# Patient Record
Sex: Male | Born: 1980 | Race: White | Hispanic: Yes | Smoking: Former smoker
Health system: Southern US, Community
[De-identification: ages and names within clinical notes are randomized; demographics above are authoritative.]

## PROBLEM LIST (undated history)

## (undated) DIAGNOSIS — E785 Hyperlipidemia, unspecified: Secondary | ICD-10-CM

## (undated) DIAGNOSIS — F410 Panic disorder [episodic paroxysmal anxiety] without agoraphobia: Secondary | ICD-10-CM

## (undated) DIAGNOSIS — Q231 Congenital insufficiency of aortic valve: Secondary | ICD-10-CM

## (undated) DIAGNOSIS — R011 Cardiac murmur, unspecified: Secondary | ICD-10-CM

## (undated) DIAGNOSIS — Q2381 Bicuspid aortic valve: Secondary | ICD-10-CM

## (undated) DIAGNOSIS — F259 Schizoaffective disorder, unspecified: Secondary | ICD-10-CM

## (undated) DIAGNOSIS — D6800 Von Willebrand disease, unspecified: Secondary | ICD-10-CM

## (undated) HISTORY — PX: TOOTH EXTRACTION: SUR596

## (undated) HISTORY — PX: OTHER SURGICAL HISTORY: SHX169

## (undated) HISTORY — DX: Hyperlipidemia, unspecified: E78.5

## (undated) HISTORY — DX: Congenital insufficiency of aortic valve: Q23.1

## (undated) HISTORY — DX: Cardiac murmur, unspecified: R01.1

## (undated) HISTORY — DX: Bicuspid aortic valve: Q23.81

---

## 2014-09-17 ENCOUNTER — Encounter: Payer: Self-pay | Admitting: Emergency Medicine

## 2014-09-17 ENCOUNTER — Emergency Department
Admission: EM | Admit: 2014-09-17 | Discharge: 2014-09-17 | Disposition: A | Discharge status: Other Acute Inpatient Hospital | Payer: Medicaid Other | Attending: Emergency Medicine | Admitting: Emergency Medicine

## 2014-09-17 DIAGNOSIS — F203 Undifferentiated schizophrenia: Secondary | ICD-10-CM

## 2014-09-17 DIAGNOSIS — F23 Brief psychotic disorder: Secondary | ICD-10-CM | POA: Diagnosis not present

## 2014-09-17 DIAGNOSIS — J45909 Unspecified asthma, uncomplicated: Secondary | ICD-10-CM

## 2014-09-17 DIAGNOSIS — R440 Auditory hallucinations: Secondary | ICD-10-CM | POA: Diagnosis present

## 2014-09-17 DIAGNOSIS — Z720 Tobacco use: Secondary | ICD-10-CM | POA: Diagnosis not present

## 2014-09-17 DIAGNOSIS — Z880 Allergy status to penicillin: Secondary | ICD-10-CM | POA: Diagnosis not present

## 2014-09-17 DIAGNOSIS — Z79899 Other long term (current) drug therapy: Secondary | ICD-10-CM | POA: Insufficient documentation

## 2014-09-17 DIAGNOSIS — F3162 Bipolar disorder, current episode mixed, moderate: Secondary | ICD-10-CM

## 2014-09-17 HISTORY — DX: Von Willebrand's disease: D68.0

## 2014-09-17 HISTORY — DX: Schizoaffective disorder, unspecified: F25.9

## 2014-09-17 HISTORY — DX: Von Willebrand disease, unspecified: D68.00

## 2014-09-17 LAB — URINE DRUG SCREEN, QUALITATIVE (ARMC ONLY)
Amphetamines, Ur Screen: NOT DETECTED
BARBITURATES, UR SCREEN: NOT DETECTED
BENZODIAZEPINE, UR SCRN: NOT DETECTED
COCAINE METABOLITE, UR ~~LOC~~: NOT DETECTED
Cannabinoid 50 Ng, Ur ~~LOC~~: NOT DETECTED
MDMA (Ecstasy)Ur Screen: NOT DETECTED
Methadone Scn, Ur: NOT DETECTED
OPIATE, UR SCREEN: NOT DETECTED
PHENCYCLIDINE (PCP) UR S: NOT DETECTED
Tricyclic, Ur Screen: NOT DETECTED

## 2014-09-17 LAB — ETHANOL: Alcohol, Ethyl (B): <5 mg/dL (ref <5)

## 2014-09-17 LAB — COMPREHENSIVE METABOLIC PANEL
ALBUMIN: 4.5 g/dL (ref 3.5–5.0)
ALK PHOS: 69 U/L (ref 38–126)
ALT: 17 U/L (ref 17–63)
ANION GAP: 7 (ref 5–15)
AST: 22 U/L (ref 15–41)
BILIRUBIN TOTAL: 1 mg/dL (ref 0.3–1.2)
BUN: 13 mg/dL (ref 6–20)
CALCIUM: 8.7 mg/dL — AB (ref 8.9–10.3)
CO2: 25 mmol/L (ref 22–32)
CREATININE: 0.78 mg/dL (ref 0.61–1.24)
Chloride: 104 mmol/L (ref 101–111)
GFR calc Af Amer: >60 mL/min (ref >60)
GFR calc non Af Amer: >60 mL/min (ref >60)
GLUCOSE: 96 mg/dL (ref 65–99)
Potassium: 4.3 mmol/L (ref 3.5–5.1)
Sodium: 136 mmol/L (ref 135–145)
TOTAL PROTEIN: 8.4 g/dL — AB (ref 6.5–8.1)

## 2014-09-17 LAB — CBC
HCT: 47.4 % (ref 40.0–52.0)
Hemoglobin: 15.7 g/dL (ref 13.0–18.0)
MCH: 31.1 pg (ref 26.0–34.0)
MCHC: 33 g/dL (ref 32.0–36.0)
MCV: 94.3 fL (ref 80.0–100.0)
Platelets: 255 10*3/uL (ref 150–440)
RBC: 5.03 MIL/uL (ref 4.40–5.90)
RDW: 14.1 % (ref 11.5–14.5)
WBC: 11.1 10*3/uL — ABNORMAL HIGH (ref 3.8–10.6)

## 2014-09-17 LAB — ACETAMINOPHEN LEVEL: Acetaminophen (Tylenol), Serum: <10 ug/mL — ABNORMAL LOW (ref 10–30)

## 2014-09-17 LAB — SALICYLATE LEVEL: Salicylate Lvl: <4 mg/dL (ref 2.8–30.0)

## 2014-09-17 MED ORDER — HALOPERIDOL 5 MG PO TABS
10.0000 mg | ORAL_TABLET | Freq: Once | ORAL | Status: AC
  Administered 2014-09-17: 10 mg via ORAL
  Filled 2014-09-17: qty 2

## 2014-09-17 MED ORDER — OLANZAPINE 10 MG PO TABS: 10.0000 mg | ORAL_TABLET | Freq: Every day | ORAL | Status: DC

## 2014-09-17 MED ORDER — BENZTROPINE MESYLATE 0.5 MG PO TABS
1.0000 mg | ORAL_TABLET | ORAL | Status: AC
  Administered 2014-09-17: 1 mg via ORAL
  Filled 2014-09-17: qty 2

## 2014-09-17 NOTE — ED Notes (Signed)
BEHAVIORAL HEALTH ROUNDING Patient sleeping: No. Patient alert and oriented: yes Behavior appropriate: Yes.  ; If no, describe:  Nutrition and fluids offered: Yes Toileting and hygiene offered: Yes  Sitter present: yes Law enforcement present: Yes ODS  

## 2014-09-17 NOTE — ED Notes (Signed)

## 2014-09-17 NOTE — ED Provider Notes (Signed)
-----------------------------------------   6:18 PM on 09/17/2014 -----------------------------------------  Case was discussed with Dr. Mat Carne packs as evaluated the patient and recommends discharge. He does not feel the patient is an acute threat to himself. He recommends discharge with Zyprexa prescription which he has provided and follow-up with the patient's psychiatrist, Dr. Cherylann Ratel.  Gayla Doss, MD 09/17/14 [phone]

## 2014-09-17 NOTE — ED Notes (Signed)
Psychiatrist name: Dr. Cherylann Ratel.

## 2014-09-17 NOTE — ED Provider Notes (Signed)
[facility] Emergency Department Provider Note REMINDER - THIS NOTE IS NOT A FINAL MEDICAL RECORD UNTIL IT IS SIGNED. UNTIL THEN, THE CONTENT BELOW MAY REFLECT INFORMATION FROM A DOCUMENTATION TEMPLATE, NOT THE ACTUAL PATIENT VISIT. ____________________________________________  Time seen: Approximately 1:29 PM  I have reviewed the triage vital signs and the nursing notes.   HISTORY  Chief Complaint Hallucinations    HPI Taylor Bryant is a 34 y.o. male reports a long-standing history of psychiatric disorder including schizophrenia since the age of approximately 74. He reports that he was having some symptoms of shaking from his Haldol, and his dose was reduced a few days ago. Since that time he has been having hallucinations which are auditory and tell him to kill himself. He denies that he would actually kill himself, but he is concerned about the symptoms. He reports previous hospitalizations and a long-standing history of similar episodes.  He denies being in any pain. He saw his doctor Dr. Cherylann Ratel who placed him under involuntary commitment today. He denies any other concerns at this time. Denies homicidal thoughts.   Past Medical History  Diagnosis Date  . Von Willebrand disease   . Schizoaffective disorder     There are no active problems to display for this patient.   History reviewed. No pertinent past surgical history.  Current Outpatient Rx  Name  Route  Sig  Dispense  Refill  . benztropine (COGENTIN) 2 MG tablet   Oral   Take 2 mg by mouth daily.         . clonazePAM (KLONOPIN) 1 MG tablet   Oral   Take 1 mg by mouth at bedtime.         Marland Kitchen escitalopram (LEXAPRO) 20 MG tablet   Oral   Take 20 mg by mouth daily.         . haloperidol (HALDOL) 20 MG tablet   Oral   Take 20 mg by mouth at bedtime.         Marland Kitchen lithium 300 MG tablet   Oral   Take 900 mg by mouth at bedtime.           Allergies Penicillins  No family history on  file.  Social History Social History  Substance Use Topics  . Smoking status: Current Some Day Smoker  . Smokeless tobacco: None  . Alcohol Use: Yes    Review of Systems Constitutional: No fever/chills Eyes: No visual changes. ENT: No sore throat. Cardiovascular: Denies chest pain. Respiratory: Denies shortness of breath. Gastrointestinal: No abdominal pain.  No nausea, no vomiting.  No diarrhea.  No constipation. Genitourinary: Negative for dysuria. Musculoskeletal: Negative for back pain. Skin: Negative for rash. Neurological: Negative for headaches, focal weakness or numbness.  10-point ROS otherwise negative.  ____________________________________________   PHYSICAL EXAM:  VITAL SIGNS: ED Triage Vitals  Enc Vitals Group     BP 09/17/14 1239 138/81 mmHg     Pulse Rate 09/17/14 1239 67     Resp 09/17/14 1239 18     Temp 09/17/14 1239 97.5 F (36.4 C)     Temp Source 09/17/14 1239 Oral     SpO2 09/17/14 1239 97 %     Weight 09/17/14 1239 190 lb (86.183 kg)     Height 09/17/14 1239  (1.854 m)     Head Cir --      Peak Flow --      Pain Score --      Pain Loc --  Pain Edu? --      Excl. in GC? --    Constitutional: Alert and oriented. Well appearing and in no acute distress. Eyes: Conjunctivae are normal. PERRL. EOMI. Head: Atraumatic. Nose: No congestion/rhinnorhea. Mouth/Throat: Mucous membranes are moist.  Oropharynx non-erythematous. Neck: No stridor.   Cardiovascular: Normal rate, regular rhythm. Grossly normal heart sounds.  Good peripheral circulation. Respiratory: Normal respiratory effort.  No retractions. Lungs CTAB. Gastrointestinal: Soft and nontender. No distention. No abdominal bruits. No CVA tenderness. Musculoskeletal: No lower extremity tenderness nor edema.  No joint effusions. Neurologic:  Normal speech and language. No gross focal neurologic deficits are appreciated. No gait instability. No tremors appreciated at this time. Skin:   Skin is warm, dry and intact. No rash noted. Psychiatric: Mood and affect are normal. Speech and behavior are normal.  ____________________________________________   LABS (all labs ordered are listed, but only abnormal results are displayed)  Labs Reviewed  COMPREHENSIVE METABOLIC PANEL - Abnormal; Notable for the following:    Calcium 8.7 (*)    Total Protein 8.4 (*)    All other components within normal limits  ACETAMINOPHEN LEVEL - Abnormal; Notable for the following:    Acetaminophen (Tylenol), Serum <10 (*)    All other components within normal limits  CBC - Abnormal; Notable for the following:    WBC 11.1 (*)    All other components within normal limits  ETHANOL  SALICYLATE LEVEL  URINE DRUG SCREEN, QUALITATIVE ([facility] ONLY)   ____________________________________________  EKG   ____________________________________________  RADIOLOGY   ____________________________________________   PROCEDURES  Procedure(s) performed: None  Critical Care performed: No  ____________________________________________   INITIAL IMPRESSION / ASSESSMENT AND PLAN / ED COURSE  Pertinent labs & imaging results that were available during my care of the patient were reviewed by me and considered in my medical decision making (see chart for details).  Patient presents for concerns of psychiatric illness which has been worsening after decreasing his Haldol dose. He reports auditory command hallucinations telling him to kill himself. His initial screening exams not done straight any acute medical etiology but he is on involuntary commitment. I ordered psychiatric consultation. I anticipate this patient will likely require inpatient psychiatric care based on my initial assessment, but deferred a psychiatrist for full evaluation.  And is notable that the patient seems to have a lot of insight into his disease, he is very calm and very appropriate and amicable in the  ER.  ----------------------------------------- 2:08 PM on 09/17/2014 -----------------------------------------  Patient medically cleared for psychiatric evaluation. ____________________________________________   FINAL CLINICAL IMPRESSION(S) / ED DIAGNOSES  Final diagnoses:  Schizophrenia, acute      Sharyn Creamer, MD 09/17/14 1408

## 2014-09-17 NOTE — ED Notes (Signed)
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 Patient reports having auditory hallucinations that are saying things initially before admission for him to buy stuff and to kill himself. He was also having visual hallucinations that were demons.  Currently patient is only having auditory hallucinations that are telling him "this is going to suck" but denies any telling him to kill himself or others.  Pt reports some paranoia, but also reports not having been hospitalized before for this and is fearful.  Patient currently sees a psychiatrist in [place], but is unsure of their name.  He denies any history of trying to kill himself or anyone else.  Has never cut himself or had any self harm.  Last alcohol use was last Saturday.  He is on medications and states he has been taking them.  He has a history of Schizoaffective, Bipolar disorder. Currently lives alone.  Reports hallucinations every 2 days.  Denies smoking or using any illegal drugs.  Pt is pleasant, but flat affect.

## 2014-09-17 NOTE — ED Notes (Signed)
BEHAVIORAL HEALTH ROUNDING Patient sleeping: No. Patient alert and oriented: yes Behavior appropriate: Yes.  ; If no, describe:  Nutrition and fluids offered: Yes  Toileting and hygiene offered: Yes  Sitter present: yes Law enforcement present: Yes  

## 2014-09-17 NOTE — ED Notes (Signed)
Pt to ED accompanied by [facility] with IVC papers in place, papers report that pt has been reporting hallucinations that area commanding him to commit suicide, delusions and mania

## 2014-09-17 NOTE — ED Notes (Signed)
BEHAVIORAL HEALTH ROUNDING Patient sleeping: No. Patient alert and oriented: yes Behavior appropriate: Yes.  ; If no, describe:  Nutrition and fluids offered: No Toileting and hygiene offered: Yes  Sitter present: ED tech performing 15 minute checks. Law enforcement present: Yes  [employer]
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
## 2014-09-17 NOTE — ED Notes (Signed)
ED BHU PLACEMENT JUSTIFICATION Is the patient under IVC or is there intent for IVC: Yes.   Is the patient medically cleared: Yes.   Is there vacancy in the ED BHU: Yes.   Is the population mix appropriate for patient: Yes.   Is the patient awaiting placement in inpatient or outpatient setting: No. Has the patient had a psychiatric consult: No. Survey of unit performed for contraband, proper placement and condition of furniture, tampering with fixtures in bathroom, shower, and each patient room: Yes.  ; Findings: No contraband APPEARANCE/BEHAVIOR Pt is calm and cooperative.  Pt reports having paranoia, but is aware of surroundings. NEURO ASSESSMENT Orientation: alert and oriented x 4 Hallucinations: Yes.  Auditory Hallucinations and Visual Hallucinations Speech: Normal Gait: normal RESPIRATORY ASSESSMENT Normal expansion.  Clear to auscultation.  No rales, rhonchi, or wheezing. CARDIOVASCULAR ASSESSMENT Regular rate.  Skin warm and dry. GASTROINTESTINAL ASSESSMENT No GI complaints. Meal given EXTREMITIES normal strength, tone, and muscle mass. Full ROM PLAN OF CARE Provide calm/safe environment. Vital signs assessed twice daily. ED BHU Assessment once each 12-hour shift. Collaborate with intake RN daily or as condition indicates. Assure the ED provider has rounded once each shift. Provide and encourage hygiene. Provide redirection as needed. Assess for escalating behavior; address immediately and inform ED provider.  Assess family dynamic and appropriateness for visitation as needed: Yes.  ; If necessary, describe findings: no known family issues. Patient lives alone. Educate the patient/family about BHU procedures/visitation: Yes.  ; If necessary, describe findings: No family present, but patient is aware of rules and is compliant.

## 2014-09-17 NOTE — Consult Note (Signed)
[facility] Face-to-Face Psychiatry Consult   Reason for Consult:  Consult for this 34 year old man who came to the emergency room stating he is having auditory hallucinations Referring Physician:  Quale Patient Identification: Taylor Bryant MRN:  [id number] Principal Diagnosis: Schizophrenia Diagnosis:   Patient Active Problem List   Diagnosis Date Noted  . Schizophrenia [F20.9] 09/17/2014  . Asthma [J45.909] 09/17/2014    Total Time spent with patient: 1 hour  Subjective:   Taylor Bryant is a 34 y.o. male patient admitted with "they changed my medicine and the voices are worse ".  HPI:  Patient states that his auditory hallucinations have gotten a little worse over the last few days. He hears voices constantly and has done so for years but they have been bothering him worse the last couple days. He also says that he's been feeling more depressed for the last few days. This started when his medicines were changed. His psychiatrist discontinued his Haldol because he was having lots of tremors and stiffness from it. They did not start him on any new anti-psychotic but left him with just his lithium and Klonopin and Cogentin. Patient says that he's been sleeping a little bit worse. Appetite is okay. He does not have any thoughts or wish of killing himself. Despite the fact that the voices tell him to do bad things like to kill himself has never tried to kill himself in the past and has no intention of doing that. Also does not feel violent or like he is going to hurt anyone else. Patient would like to be back on some medication. He is not drinking or abusing any drugs currently. Use some alcohol about a week ago.  Past psychiatric history: Long-standing history of schizophrenia or schizoaffective disorder. Despite this he's never been in a psychiatric hospital. Telford Nab states per day compliant with his medicine. No history of suicide attempts or violence towards others. He said he's been on several medicines in  the past. He often gets stiffness from them. The Zyprexa however was pretty helpful.  Family history: He says he has a couple of uncles who had mental health problems  Social history: Currently living by himself. He and his wife separated about 3 months ago. He does not get disability but his family in [place] sends him money. He does have Medicaid and is seen by a local mental health provider. Doesn't have any children. Not currently working.  Medical history: Mild asthma also reportedly has von Willebrand's disease  Current medication: Lithium and Cogentin and Klonopin and no interest psychotic for the last few days  Substance abuse history: Says he used to drink quite a bit and it caused him some problems. He stopped drinking many months ago. Had a relapse last weekend but none since then. HPI Elements:   Quality:  Auditory hallucinations and some mood. Severity:  Moderate to severe but not immediately life-threatening. Timing:  Long-standing issue worse over the last week. Duration:  Chronic issue. Context:  Recent change in medicine.  Past Medical History:  Past Medical History  Diagnosis Date  . Von Willebrand disease   . Schizoaffective disorder    History reviewed. No pertinent past surgical history. Family History: No family history on file. Social History:  History  Alcohol Use  . Yes     History  Drug Use No    Social History   Social History  . Marital Status: Single    Spouse Name: N/A  . Number of Children: N/A  .  Years of Education: N/A   Social History Main Topics  . Smoking status: Current Some Day Smoker  . Smokeless tobacco: None  . Alcohol Use: Yes  . Drug Use: No  . Sexual Activity: Not Asked   Other Topics Concern  . None   Social History Narrative  . None   Additional Social History:    Pain Medications: None Reported Prescriptions: None Reported Over the Counter: None Reported History of alcohol / drug use?: No history of alcohol /  drug abuse Longest period of sobriety (when/how long):  (None Reported) Negative Consequences of Use:  (None Reported) Withdrawal Symptoms:  (None Reported)                     Allergies:   Allergies  Allergen Reactions  . Penicillins Rash    Labs:  Results for orders placed or performed during the hospital encounter of 09/17/14 (from the past 48 hour(s))  Comprehensive metabolic panel     Status: Abnormal   Collection Time: 09/17/14 12:53 PM  Result Value Ref Range   Sodium 136 135 - 145 mmol/L   Potassium 4.3 3.5 - 5.1 mmol/L   Chloride 104 101 - 111 mmol/L   CO2 25 22 - 32 mmol/L   Glucose, Bld 96 65 - 99 mg/dL   BUN 13 6 - 20 mg/dL   Creatinine, Ser 0.78 0.61 - 1.24 mg/dL   Calcium 8.7 (L) 8.9 - 10.3 mg/dL   Total Protein 8.4 (H) 6.5 - 8.1 g/dL   Albumin 4.5 3.5 - 5.0 g/dL   AST 22 15 - 41 U/L   ALT 17 17 - 63 U/L   Alkaline Phosphatase 69 38 - 126 U/L   Total Bilirubin 1.0 0.3 - 1.2 mg/dL   GFR calc non Af Amer >60 >60 mL/min   GFR calc Af Amer >60 >60 mL/min    Comment: (NOTE) The eGFR has been calculated using the CKD EPI equation. This calculation has not been validated in all clinical situations. eGFR's persistently <60 mL/min signify possible Chronic Kidney Disease.    Anion gap 7 5 - 15  Ethanol (ETOH)     Status: None   Collection Time: 09/17/14 12:53 PM  Result Value Ref Range   Alcohol, Ethyl (B) <5 <5 mg/dL    Comment:        LOWEST DETECTABLE LIMIT FOR SERUM ALCOHOL IS 5 mg/dL FOR MEDICAL PURPOSES ONLY   Salicylate level     Status: None   Collection Time: 09/17/14 12:53 PM  Result Value Ref Range   Salicylate Lvl <8.4 2.8 - 30.0 mg/dL  Acetaminophen level     Status: Abnormal   Collection Time: 09/17/14 12:53 PM  Result Value Ref Range   Acetaminophen (Tylenol), Serum <10 (L) 10 - 30 ug/mL    Comment:        THERAPEUTIC CONCENTRATIONS VARY SIGNIFICANTLY. A RANGE OF 10-30 ug/mL MAY BE AN EFFECTIVE CONCENTRATION FOR MANY  PATIENTS. HOWEVER, SOME ARE BEST TREATED AT CONCENTRATIONS OUTSIDE THIS RANGE. ACETAMINOPHEN CONCENTRATIONS >150 ug/mL AT 4 HOURS AFTER INGESTION AND >50 ug/mL AT 12 HOURS AFTER INGESTION ARE OFTEN ASSOCIATED WITH TOXIC REACTIONS.   CBC     Status: Abnormal   Collection Time: 09/17/14 12:53 PM  Result Value Ref Range   WBC 11.1 (H) 3.8 - 10.6 K/uL   RBC 5.03 4.40 - 5.90 MIL/uL   Hemoglobin 15.7 13.0 - 18.0 g/dL   HCT 47.4 40.0 - 52.0 %  MCV 94.3 80.0 - 100.0 fL   MCH 31.1 26.0 - 34.0 pg   MCHC 33.0 32.0 - 36.0 g/dL   RDW 14.1 11.5 - 14.5 %   Platelets 255 150 - 440 K/uL  Urine Drug Screen, Qualitative ([facility] only)     Status: None   Collection Time: 09/17/14 12:53 PM  Result Value Ref Range   Tricyclic, Ur Screen NONE DETECTED NONE DETECTED   Amphetamines, Ur Screen NONE DETECTED NONE DETECTED   MDMA (Ecstasy)Ur Screen NONE DETECTED NONE DETECTED   Cocaine Metabolite,Ur [place] NONE DETECTED NONE DETECTED   Opiate, Ur Screen NONE DETECTED NONE DETECTED   Phencyclidine (PCP) Ur S NONE DETECTED NONE DETECTED   Cannabinoid 50 Ng, Ur [place] NONE DETECTED NONE DETECTED   Barbiturates, Ur Screen NONE DETECTED NONE DETECTED   Benzodiazepine, Ur Scrn NONE DETECTED NONE DETECTED   Methadone Scn, Ur NONE DETECTED NONE DETECTED    Comment: (NOTE) 482  Tricyclics, urine               Cutoff 1000 ng/mL 200  Amphetamines, urine             Cutoff 1000 ng/mL 300  MDMA (Ecstasy), urine           Cutoff 500 ng/mL 400  Cocaine Metabolite, urine       Cutoff 300 ng/mL 500  Opiate, urine                   Cutoff 300 ng/mL 600  Phencyclidine (PCP), urine      Cutoff 25 ng/mL 700  Cannabinoid, urine              Cutoff 50 ng/mL 800  Barbiturates, urine             Cutoff 200 ng/mL 900  Benzodiazepine, urine           Cutoff 200 ng/mL 1000 Methadone, urine                Cutoff 300 ng/mL 1100 1200 The urine drug screen provides only a preliminary, unconfirmed 1300 analytical test result and should  not be used for non-medical 1400 purposes. Clinical consideration and professional judgment should 1500 be applied to any positive drug screen result due to possible 1600 interfering substances. A more specific alternate chemical method 1700 must be used in order to obtain a confirmed analytical result.  1800 Gas chromato graphy / mass spectrometry (GC/MS) is the preferred 1900 confirmatory method.     Vitals: Blood pressure 138/81, pulse 67, temperature 97.5 F (36.4 C), temperature source Oral, resp. rate 18, height _0  (1.854 m), weight 86.183 kg (190 lb), SpO2 97 %.  Risk to Self: Suicidal Ideation: No Suicidal Intent: No Is patient at risk for suicide?: No Suicidal Plan?: No Access to Means: No What has been your use of drugs/alcohol within the last 12 months?: None Reported How many times?: 1 (Per the report of his wife) Other Self Harm Risks: None Reported Triggers for Past Attempts: Unpredictable (Per his wife, he become manic every June) Intentional Self Injurious Behavior: None Risk to Others:   Prior Inpatient Therapy:   Prior Outpatient Therapy:    No current facility-administered medications for this encounter.   Current Outpatient Prescriptions  Medication Sig Dispense Refill  . benztropine (COGENTIN) 2 MG tablet Take 2 mg by mouth daily.    . clonazePAM (KLONOPIN) 0.5 MG tablet Take 0.5 mg by mouth 2 (two) times daily.    Marland Kitchen  escitalopram (LEXAPRO) 10 MG tablet Take 10 mg by mouth daily.    . haloperidol (HALDOL) 20 MG tablet Take 20 mg by mouth at bedtime.    Marland Kitchen lithium carbonate (ESKALITH) 450 MG CR tablet Take 900 mg by mouth at bedtime.    Marland Kitchen OLANZapine (ZYPREXA) 10 MG tablet Take 1 tablet (10 mg total) by mouth at bedtime. 30 tablet 0    Musculoskeletal: Strength & Muscle Tone: within normal limits Gait & Station: normal Patient leans: N/A  Psychiatric Specialty Exam: Physical Exam  Nursing note and vitals reviewed. Constitutional: He appears  well-developed and well-nourished.  HENT:  Head: Normocephalic and atraumatic.  Eyes: Conjunctivae are normal. Pupils are equal, round, and reactive to light.  Neck: Normal range of motion.  Cardiovascular: Normal heart sounds.   Respiratory: Effort normal.  GI: Soft.  Musculoskeletal: Normal range of motion.  Neurological: He is alert.  Skin: Skin is warm and dry.  Psychiatric: He has a normal mood and affect. His behavior is normal. Judgment and thought content normal. His speech is delayed. Cognition and memory are normal.    Review of Systems  Constitutional: Negative.   HENT: Negative.   Eyes: Negative.   Respiratory: Negative.   Cardiovascular: Negative.   Gastrointestinal: Negative.   Musculoskeletal: Negative.   Skin: Negative.   Neurological: Negative.   Psychiatric/Behavioral: Positive for hallucinations. Negative for depression, suicidal ideas, memory loss and substance abuse. The patient is not nervous/anxious and does not have insomnia.     Blood pressure 138/81, pulse 67, temperature 97.5 F (36.4 C), temperature source Oral, resp. rate 18, height _0  (1.854 m), weight 86.183 kg (190 lb), SpO2 97 %.Body mass index is 25.07 kg/(m^2).  General Appearance: Neat  Eye Contact::  Good  Speech:  Clear and Coherent  Volume:  Decreased  Mood:  Anxious  Affect:  Flat  Thought Process:  Linear  Orientation:  Full (Time, Place, and Person)  Thought Content:  Hallucinations: Auditory  Suicidal Thoughts:  No  Homicidal Thoughts:  No  Memory:  Immediate;   Good Recent;   Fair Remote;   Fair  Judgement:  Intact  Insight:  Present  Psychomotor Activity:  Normal  Concentration:  Good  Recall:  Good  Fund of Knowledge:Good  Language: Fair  Akathisia:  No  Handed:  Right  AIMS (if indicated):     Assets:  Communication Skills Desire for Improvement Housing Leisure Time Resilience  ADL's:  Intact  Cognition: WNL  Sleep:      Medical Decision Making: Review of  Psycho-Social Stressors (1), Review or order clinical lab tests (1), Established Problem, Worsening (2), Review or order medicine tests (1), Review of Medication Regimen & Side Effects (2) and Review of New Medication or Change in Dosage (2)  Treatment Plan Summary: Medication management and Plan This is a patient who appears to have schizophrenia. He has good insight and judgment. No history of suicidal behavior or dangerous behavior. He is willing to be compliant with medicine. Is not abusing drugs. Although his auditory hallucinations have suicidal content he has good insight and is not acting on them. At this point I don't think he meets commitment criteria. He is agreeable to restarting medicine. I propose giving him a prescription for olanzapine 10 mg at night. Side effects discussed. Patient is very willing to start it. He will follow-up with his community mental health team with psychotherapeutic services. Supportive counseling done. Reminded him that he can come back to the emergency  room if necessary any time. Discontinue IVC. Follow-up in the community. Case discussed with emergency room doctor  Plan:  Patient does not meet criteria for psychiatric inpatient admission. Supportive therapy provided about ongoing stressors. Discussed crisis plan, support from social network, calling 911, coming to the Emergency Department, and calling Suicide Hotline. Disposition: Discontinue IVC  Prescription written follow-up in the community  [place] 09/17/2014 5:15 PM

## 2014-09-17 NOTE — ED Notes (Signed)
Pt's lunch finally came - so letting him finish it before leaving. He is going to catch a taxi

## 2017-08-29 ENCOUNTER — Ambulatory Visit: Payer: Self-pay

## 2017-09-10 ENCOUNTER — Ambulatory Visit: Payer: Self-pay | Admitting: Family Medicine

## 2017-09-10 VITALS — BP 119/75 | HR 76 | Temp 97.6°F | Wt 194.4 lb

## 2017-09-10 DIAGNOSIS — F3160 Bipolar disorder, current episode mixed, unspecified: Secondary | ICD-10-CM

## 2017-09-10 DIAGNOSIS — Z09 Encounter for follow-up examination after completed treatment for conditions other than malignant neoplasm: Secondary | ICD-10-CM

## 2017-09-10 DIAGNOSIS — Z8679 Personal history of other diseases of the circulatory system: Secondary | ICD-10-CM

## 2017-09-10 DIAGNOSIS — Z0000 Encounter for general adult medical examination without abnormal findings: Secondary | ICD-10-CM

## 2017-09-10 DIAGNOSIS — Z7689 Persons encountering health services in other specified circumstances: Secondary | ICD-10-CM
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
## 2017-09-11 LAB — COMPREHENSIVE METABOLIC PANEL
ALT: 17 IU/L (ref 0–44)
AST: 19 IU/L (ref 0–40)
Albumin/Globulin Ratio: 1.4 (ref 1.2–2.2)
Albumin: 4.6 g/dL (ref 3.5–5.5)
Alkaline Phosphatase: 101 IU/L (ref 39–117)
BUN/Creatinine Ratio: 11 (ref 9–20)
BUN: 10 mg/dL (ref 6–20)
Bilirubin Total: 0.3 mg/dL (ref 0.0–1.2)
CO2: 21 mmol/L (ref 20–29)
Calcium: 9 mg/dL (ref 8.7–10.2)
Chloride: 100 mmol/L (ref 96–106)
Creatinine, Ser: 0.91 mg/dL (ref 0.76–1.27)
GFR calc Af Amer: 122 mL/min/{1.73_m2} (ref >59)
GFR calc non Af Amer: 106 mL/min/{1.73_m2} (ref >59)
Globulin, Total: 3.4 g/dL (ref 1.5–4.5)
Glucose: 85 mg/dL (ref 65–99)
Potassium: 4.5 mmol/L (ref 3.5–5.2)
Sodium: 136 mmol/L (ref 134–144)
Total Protein: 8 g/dL (ref 6.0–8.5)

## 2017-09-11 LAB — URINALYSIS, DIPSTICK ONLY
Bilirubin, UA: NEGATIVE
Glucose, UA: NEGATIVE
Ketones, UA: NEGATIVE
Leukocytes, UA: NEGATIVE
Nitrite, UA: NEGATIVE
Protein, UA: NEGATIVE
RBC, UA: NEGATIVE
Specific Gravity, UA: 1.016 (ref 1.005–1.030)
Urobilinogen, Ur: 0.2 mg/dL (ref 0.2–1.0)
pH, UA: 5 (ref 5.0–7.5)

## 2017-09-11 LAB — HEMOGLOBIN A1C
Est. average glucose Bld gHb Est-mCnc: 114 mg/dL
Hgb A1c MFr Bld: 5.6 % (ref 4.8–5.6)

## 2017-09-11 LAB — LIPID PANEL
Chol/HDL Ratio: 6.7 ratio — ABNORMAL HIGH (ref 0.0–5.0)
Cholesterol, Total: 249 mg/dL — ABNORMAL HIGH (ref 100–199)
HDL: 37 mg/dL — ABNORMAL LOW (ref >39)
LDL Calculated: 170 mg/dL — ABNORMAL HIGH (ref 0–99)
Triglycerides: 210 mg/dL — ABNORMAL HIGH (ref 0–149)
VLDL Cholesterol Cal: 42 mg/dL — ABNORMAL HIGH (ref 5–40)

## 2017-09-11 LAB — TSH: TSH: 1.69 u[IU]/mL (ref 0.450–4.500)

## 2017-09-15 ENCOUNTER — Other Ambulatory Visit: Payer: Self-pay | Admitting: Family Medicine

## 2017-09-15 DIAGNOSIS — E785 Hyperlipidemia, unspecified: Secondary | ICD-10-CM

## 2017-09-15 MED ORDER — ATORVASTATIN CALCIUM 10 MG PO TABS: 10.0000 mg | ORAL_TABLET | Freq: Every day | ORAL | 3 refills | Status: DC

## 2017-09-15 NOTE — Progress Notes (Signed)
Rx for Lipitor sent to pharmacy today.  

## 2017-09-17 ENCOUNTER — Telehealth: Payer: Self-pay | Admitting: Adult Health Nurse Practitioner

## 2017-09-17 NOTE — Telephone Encounter (Signed)
Spoke to patient, let him know labs are stable except for Lipids which are mildly elevated and rx for Lipitor sent to pharmacy

## 2017-09-26 ENCOUNTER — Ambulatory Visit: Payer: Self-pay | Admitting: Adult Health Nurse Practitioner

## 2017-09-26 VITALS — BP 118/64 | Ht 70.0 in | Wt 194.4 lb

## 2017-09-26 DIAGNOSIS — E785 Hyperlipidemia, unspecified: Secondary | ICD-10-CM

## 2017-09-26 DIAGNOSIS — D6800 Von Willebrand disease, unspecified: Secondary | ICD-10-CM | POA: Insufficient documentation

## 2017-09-26 DIAGNOSIS — F203 Undifferentiated schizophrenia: Secondary | ICD-10-CM

## 2017-09-26 MED ORDER — ATORVASTATIN CALCIUM 40 MG PO TABS: 40.0000 mg | ORAL_TABLET | Freq: Every day | ORAL | 3 refills | Status: DC

## 2017-09-26 NOTE — Progress Notes (Signed)
  Patient: Taylor Bryant Male    DOB: 07/07/1980   37 y.o.   MRN: [id number] Visit Date: 09/26/2017  Today's Provider: Jacelyn Pieah Doles-Johnson, NP   No chief complaint on file.  Subjective:    HPI  Pt states that he has bipolar and anxiety that is untreated since he lost his insurance. Was a patient at [facility] in the past.   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  Continues to have dizziness. No acute bleeding.   States that he has nausea after he eats no matter what he eats. Taking pepcid with moderate results.   HLD- taking lipitor. LDL-170 on labs.     Allergies  Allergen Reactions  . Penicillins Rash   Previous Medications   No medications on file    Review of Systems  All other systems reviewed and are negative.   Social History   Tobacco Use  . Smoking status: Former Smoker    Types: Cigarettes  Substance Use Topics  . Alcohol use: Yes   Objective:   BP 118/64   Ht 5\' 10"  (1.778 m)   Wt 194 lb 6.4 oz (88.2 kg)   BMI 27.89 kg/m   Physical Exam  Constitutional: He is oriented to person, place, and time. He appears well-developed and well-nourished.  HENT:  Head: Normocephalic and atraumatic.  Cardiovascular: Normal rate, regular rhythm, normal heart sounds and intact distal pulses.  Pulmonary/Chest: Effort normal and breath sounds normal.  Abdominal: Soft. Bowel sounds are normal.  Neurological: He is alert and oriented to person, place, and time.  Skin: Skin is warm and dry.  Vitals reviewed.       Assessment & Plan:        Bipolar:  [facility] with collaborative medication management if appropriate.   HLD:  Not Controlled.  Increase Lipitor to 40mg  daily.  Encourage low cholesterol, low fat diet and exercise.   BP well controlled.  Will refer to Cardio for work up.  Complete charity care paperwork.   Nausea? Secondary  to GERD- continue Pepcid.  Avoid triggers.  Routine FU 3 months.    Jacelyn Pieah Doles-Johnson, NP   [facility]

## 2017-10-01 ENCOUNTER — Ambulatory Visit: Payer: Self-pay | Admitting: Licensed Clinical Social Worker

## 2017-10-01 DIAGNOSIS — F411 Generalized anxiety disorder: Secondary | ICD-10-CM

## 2017-10-01 DIAGNOSIS — F333 Major depressive disorder, recurrent, severe with psychotic symptoms: Secondary | ICD-10-CM

## 2017-10-01 DIAGNOSIS — F410 Panic disorder [episodic paroxysmal anxiety] without agoraphobia: Secondary | ICD-10-CM

## 2017-10-01 NOTE — Progress Notes (Signed)
Total time:30 minutes Type of Service: Integrated Behavioral Health in clinic Interpretor:No.   SUBJECTIVE: Taylor Bryant is a 37 y.o. male  referred by Taylor Bryant nurse practicioner for symptoms of:  shizoaffective disorder. Patient is accompanied by himself.  Patient reports the following symptoms and or concerns: anxiety, decreased appetite, depression, fatigue, irritability, loss of interest in favorite activities, mood swings, sleep disturbance and paranoia. 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 He reports that his symptoms of depression have been present since he was a small child. His symptoms include: irritability, anger and angry outbursts, paranoia, feeling down and depressed nearly every day, isolating himself away from others, difficulty sleeping, fatigue, poor appetite, lost about twenty to twenty five pounds in the last few months, feeling bad about himself, and loss of interest in previously enjoyed activities. He denies suicidal and homicidal thoughts. He denies a plan, means, or intent. He describes symptoms of generalized anxiety disorder that starting at the age of 37 as evidenced by excessive worrying nearly every day, feeling restless or on edge, fatigue, decreased ability to concentrate, irritability, difficulty sleeping, difficulty relaxing, and feeling afraid as if something awful might happen. He describes having five to six panic attacks a day. He notes that his first episodes of a panic attack was at the age of 37 after he was diagnosed with a heart condition. His symptoms include: chest pain, pain in his left arm, shortness of breath, sweating, trembling or shaking, difficulty breathing, hyperventilating, dizziness, and nauseous. He notes that these episodes last fifteen to twenty minutes. He denies any history of trauma. He denies any history of abuse or neglect.    Impact  on function: Taylor Bryant reports that his history of mental illness and current symptoms makes it difficult for him to be able to keep and hold down a job. He explains that this is why he is applying for disability.  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  He reports that he started using marijuana at the age of 37, twenty dollars per day and the last use was May 12 th 2015. He admits to previously abusing Xanax starting at the age of 37 to 6223, then from 7227 to 6329, from 2016 to this year, daily use, 6 mg per day, oral, and the last use July of this year. He denies ever being in detox, inpatient, or outpatient treatment for substance abuse.  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 Developmental history: Taylor Bryant reports that his mom was pregnant with him at the age of 37, had a really bad allergy so she took medicine, and didn't know what she was pregnant. He was told that it caused issues for him. He reports that he had delays in his development; talking at 21seven years old, speech issues, problems with growth, and no issues with potty training.  - Hospitalizations/ Outpatient therapy:  Taylor Bryant reports that he was hospitalized in [place] and [facility] for mental illness. He reports that he has only  been hospitalized in the [place] once and six or seven times in [place] He was last hospitalized for three years ago, argument with previous psychiatrist at [facility] and was involuntarily committed at [facility]. He is a former patient of Dr. Alvie Heidelberg at [facility] for about three to four years until he lost his Medicaid in the last two to three months. He explains that he decided to leave [facility] because he was tired of having the therapists that he was working with leave him without notice.  -Pharmacotherapy: Taylor Bryant reports that he has been prescribed Lithium 450 mg three time daily, Cymbalta 60 mg daily, and previously Clonazepam 0.5 mg once daily. He reports that these medications were prescribed by Dr. Rogers Blocker at [facility] and was on these medications for the past three to four months. He notes that the Lithium has been effective and helps to balance out his mood. He reports that he previously used to abuse Xanax while in [place] and it didn't really help with his anxiety.  -Family history of psychiatric issues: Taylor Bryant reports that his uncles on his dad's side have bipolar disorder. He reports that on his mom's side of the family that there is a history of anxiety.      LIFE CONTEXT:  Family & Social:,patient lives alone  Taylor Bryant is divorced and has a 54 year old daughter. He shares joint custody with his ex wife. He reports that his dad and friend in [place] He reports that his ex wife has been supportive.   He reports that he was born and raised by both his parents in [place] He was one of three siblings. He explains that both his parents worked a lot and he spent a lot of time along. He describes his childhood has being awful because he was scared to go to school, bullied a lot, wasn't happy, and that he had no one who cared about him.  Currently employed:No. Taylor Bryant is in the process of applying for disability.    What is the last grade of school you completed? Taylor Bryant reports that he has a master's in human resources from a school in [place] 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  Are you active with community agencies/resources/homecare? Yes Agency Name:  [facility], [address], mosque or other faith based community? No  clubs or social organizations? Taylor Bryant has a belief in God but does not attend church. What do you do for fun?  Hobbies?  Interests? Taylor Bryant does not have any hobbies.  Recent Life changes: Taylor Bryant has lost his Medicaid, his mental health provider, went through a divorce in the last several months.   GOALS ADDRESSED:  Patient will reduce symptoms of: anxiety, depression, insomnia, mood instability and stress; increase ability ZO:XWRUEA skills, healthy habits, self-management skills and stress reduction, will also :Increase healthy adjustment to current life circumstances.  INTERVENTIONS:Biopsychosocial assessment completed, Psychoeducation and/or Health Education, Mindfulness or Relaxation Training , Reflective listening, emotional support, crisis intervention/ stabilization, Standardized Assessments completed: PHQ 9= 18,indication of: severe depression. GAD-7= 20 indication of: severe anxiety.   ISSUES DISCUSSED: Integrated care services, support system, previous and current coping skills, community resources , community support, things patient enjoy or use to enjoy doing, anxiety, depression, panic attacks, stress, divorce, family history, and current stressors.    ASSESSMENT:  Patient currently experiencing symptoms of anxiety, depression,  and panic attacks.  Symptoms exacerbated by divorce, applying for disability, and not being able to work.  Patient may benefit from, and is in agreement to receive further assessment and brief therapeutic interventions to assist with managing symptoms.   PLAN: . Patient will F/U [name], LCSW . Behavioral recommendations: Recommendation that Taylor Bryant start cognitive behavioral therapy with [name], LCSW focusing on symptoms of anxiety, depression, and panic attacks. Follow up in one week or earlier if needed.  . Referral:Integrated Behavioral Health Services (In  Clinic) and Psychiatrist,  . : Clinician will consulate with Dr. Mare Ferrari psychiatrist regarding Taylor Bryant on September 25th 2019 at approximately 12 pm.   Warm Hand Off Completed.

## 2017-10-08 ENCOUNTER — Ambulatory Visit: Payer: Self-pay | Admitting: Licensed Clinical Social Worker

## 2017-10-08 DIAGNOSIS — F333 Major depressive disorder, recurrent, severe with psychotic symptoms: Secondary | ICD-10-CM

## 2017-10-08 DIAGNOSIS — F411 Generalized anxiety disorder: Secondary | ICD-10-CM

## 2017-10-08 DIAGNOSIS — F410 Panic disorder [episodic paroxysmal anxiety] without agoraphobia: Secondary | ICD-10-CM

## 2017-10-08 NOTE — Progress Notes (Signed)
Integrated Behavioral Health Follow Up Visit Consult completed with Dr. Mare Ferrari on Wednesday September 23rd, it was recommended that Mr. Flett continue on his Cymbalta 60 mg daily and his Lithium 450 mg three times daily for mood stabilization previously prescribed by Dr. Rogers Blocker at [facility]. It is also recommended that his records be obtained from [facility] in regards to past treatment history. Recommendation that client follow up weekly for psychotherapy with [name] LCSW.  MRN: [id number] Name: Taylor Bryant  Number of Integrated Behavioral Health Clinician visits: 1/6 Session Start time: 3:30 PM   Session End time:  Total time: 50 minutes  Type of Service: Integrated Behavioral Health- Individual/Family Interpretor:No. Interpretor Name and Language: N/A.  SUBJECTIVE: Taylor Bryant is a 37 y.o. male accompanied by himself. Patient was referred by Jacelyn Pi for mental health treatment. Patient reports the following symptoms/concerns:  Duration of problem: Mr. Hatlestad reports that he has been impulsive in the last month by getting four tattoos, piercing's, and spending money he doesn't have on beer that he has yet to drink.He describes having crying spells daily. He notes that his last panic attack was a week ago. He notes that he feels off not like himself and is not sure why. He notes that he is only taking his Lithium because he wasn't able to afford the Cymbalta at [facility]. ; Severity of problem: severe  OBJECTIVE: Mood: Anxious and Depressed and Affect: Appropriate Risk of harm to self or others: No plan to harm self or others  LIFE CONTEXT: Family and Social: Mr. Satz reports that he is extremely worried about his father who just turned 32 today, fell a week ago and broke his rib. He notes that he wants to be with his father and make sure he is okay but it will be two more weeks until he will visit him in [place]  School/Work: Mr. Gentle expressed a  desire to find a job and work with the [employer] degree he received in Banker resources.  Self-Care: Mr. Conde reports that the only thing that really keeps him going is his love for his daughter. He explains that he has limited to no support.  Life Changes: Mr. Toomey reports that he lays in bed the majority of the time. He explains that his thoughts are constantly on his health and a cardiologist told him that he would need heart surgery at the age of 40 while living in [place] He expressed a irrational fear of death due to not having his heart checked out in the last six years.   GOALS ADDRESSED: Patient will: 1.  Reduce symptoms of: anxiety, depression and stress  2.  Increase knowledge and/or ability of: self-management skills  3.  Demonstrate ability to: Increase healthy adjustment to current life circumstances and learn to set healthy boundaries with the people in his life who are not supportive.  INTERVENTIONS: Interventions utilized:  Brief CBT and Psychoeducation and/or Health Education Standardized Assessments completed: Will be administered at next session on 10/15/17. Release of information obtained for [employer] and faxed to obtain medical records.   ASSESSMENT: Patient currently experiencing anxiety, depression, and stress.   Patient may benefit from continuing psychotherapy and lifestyle changes.  PLAN: 1. Follow up with behavioral health clinician on : Tuesday October 8th @ 12: 00 pm.  2. Behavioral recommendations:  Fill out paperwork for charity care and provide all documentation for referral to cardiologist. Lenox Ahr out a new application for Medication Management and  turn in all documents required to be able to receive his medications for free. Set healthy boundaries with his loved ones and friends.  3. Referral(s): Integrated [facility] (In Clinic) 4. "From scale of 1-10, how likely are you to follow plan?": 8  [name],  LCSW

## 2017-10-15 ENCOUNTER — Ambulatory Visit: Payer: Self-pay | Admitting: Licensed Clinical Social Worker

## 2017-10-15 DIAGNOSIS — F411 Generalized anxiety disorder: Secondary | ICD-10-CM

## 2017-10-15 DIAGNOSIS — F333 Major depressive disorder, recurrent, severe with psychotic symptoms: Secondary | ICD-10-CM

## 2017-10-15 NOTE — Progress Notes (Signed)
Integrated Behavioral Health Follow Up Visit  MRN: [id number] Name: Taylor Bryant  Number of Integrated Behavioral Health Clinician visits: 3/6 Total time: 1 hour  Type of Service: Integrated Behavioral Health- Individual/Family Interpretor:No. Interpretor Name and Language: Not applicable.  SUBJECTIVE: Taylor Bryant is a 37 y.o. male accompanied by himself. Patient was referred by Jacelyn Pi nurse pracitcioner for symptoms of depression and anxiety. Patient reports the following symptoms/concerns: Taylor Bryant reports that he woke up feeling depressed today and had a crying spell due to it being the anniversary of his mom's death. He explains that he is having trouble with his ability to relax. He notes that he feels slightly better anxiety wise now that an appointment with a [facility] cardiologist has been made. He notes that he received a prescription savings card for his medications at [facility]. He explains that he feels like he can't be around his friends because he doesn't drink or do drugs anymore. He notes that he has a fear of saying no to his friends because rejection is hard. He notes that he feels like he has been out of control due having a total of 102 tattoos now. He notes that when he moved to [place] in the last year that he started out with 27 or 28 tattoos. He explains that he only has the support of his daughter and ex wife. He describes when he is alone that he has racing thoughts about making poor choices. He denies suicidal and homicidal thoughts.  Duration of problem: several years; Severity of problem: severe  OBJECTIVE: Mood: Depressed and Affect: Appropriate Risk of harm to self or others: No plan to harm self or others  LIFE CONTEXT: Family and Social: Taylor Bryant reports that he is having a hard time today because it's the anniversary of his mom's death. He notes that he was very close with his mother and his daughter resembles his mother's facial features. He  notes that he talked with his dad and is looking forward to visiting him in another week in [place]  School/Work: Taylor Bryant reports that he feels like he hasn't done anything positive with his life since arriving in [place].  Self-Care: Taylor Bryant reports that he was spending quite a bid of time with his daughter in the last week until she became sick on Sunday and had to take her back to her ex wife's apartment.  Life Changes: Taylor Bryant reports that he would like to work but realizes he needs to get his mental health and physical health in order.   GOALS ADDRESSED: Patient will: 1.  Reduce symptoms of: anxiety, depression and stress  2.  Increase knowledge and/or ability of: coping skills, healthy habits, self-management skills and stress reduction  3.  Demonstrate ability to: Increase healthy adjustment to current life circumstances  INTERVENTIONS: Interventions utilized:  Brief CBT and Psychoeducation and/or Health Education Clinician suggested that the patient come up with some life goals. She provided the client with a handout for behavioral activation to work to establish a routine for the morning, afternoon, and evening. She explained that studies show that establishing a routine can help curb the symptoms of depression.  Standardized Assessments completed: Will be completed at next follow up session.  ASSESSMENT: Patient currently experiencing depression, anxiety, and racing thoughts.   Patient may benefit from continuing therapy.   PLAN: 1. Follow up with behavioral health clinician on : in one week on Oct. 15th @ 10:30 am.  2. Behavioral  recommendations: Release of information faxed to [employer] to obtain diagnosis and medication history.  3. Referral(s): Integrated [facility] (In Clinic) 4. "From scale of 1-10, how likely are you to follow plan?": 9  [name], LCSW

## 2017-10-22 ENCOUNTER — Ambulatory Visit: Payer: Self-pay | Admitting: Licensed Clinical Social Worker

## 2017-10-22 DIAGNOSIS — F411 Generalized anxiety disorder: Secondary | ICD-10-CM

## 2017-10-22 DIAGNOSIS — F333 Major depressive disorder, recurrent, severe with psychotic symptoms: Secondary | ICD-10-CM

## 2017-10-22 DIAGNOSIS — F410 Panic disorder [episodic paroxysmal anxiety] without agoraphobia: Secondary | ICD-10-CM

## 2017-10-22 NOTE — Progress Notes (Cosign Needed)
Taylor Bullocks, LCSW scribing for psychiatrist Dr. Mare Ferrari. Case consultation with Dr. Mare Ferrari on Wednesday October 10th @ 12 pm. Last Clinical comprehensive assessment received and reviewed from [employer] on June 6th of this year. Dr. Mare Ferrari recommended that based on the results of the patient's initial assessment by her and from the one completed by another therapist at another facility that he remain on Cymbalta 60 mg daily for anxiety and Lithium 450 mg three times daily depression and psychotic features. A lab will need to be ordered to determine the patient's lithium level to record and document due to the affect that Lithium can have on the kidney's. Recommendation that Taylor Bryant continue to follow up for weekly therapy with Taylor Bullocks, LCSW for symptoms of depression, anxiety, and panic attacks. Patient was informed of case consultation and recommendations during today's session. Patient is agreeable to the plans. No further recommendations at this time.   Integrated Behavioral Health Follow Up Visit  MRN: [id number] Name: Taylor Bryant  Number of Integrated Behavioral Health Clinician visits: 4/6 Type of Service: Integrated Behavioral Health- Individual/Family Interpretor:No. Interpretor Name and Language: Not applicable.   SUBJECTIVE: Taylor Bryant is a 37 y.o. male accompanied by himself. Patient was referred by Jacelyn Pi for mental health. Patient reports the following symptoms/concerns: Mr. Buchta reports that he thought he would excited when he woke up this morning because tomorrow he will be flying to [place] to visit his father and extended family but instead feels down and depressed. He described feeling paranoid that his daughter would end up just like him in terms of mental health problems and that his ex wife would take his daughter to [place] but never return. He reports that he is taking his medicine and eating well. He explains that his ex wife threatens  to have him involuntarily committed when he will not give her money to get braces, fly to [place], and pay for things that she wants that have nothing to do with his daughter. He explains that his wife has not only threatened to have him committed in the past but succeeded when they lived in [place] and he was hospitalized for a month. He notes that he is having a difficulty sleeping and had a panic attack yesterday. He denies experiencing auditory or visual hallucinations. He denies suicidal and homicidal thoughts.  Duration of problem: Since childhood; Severity of problem: severe  OBJECTIVE: Mood: Depressed and Affect: Appropriate Risk of harm to self or others: No plan to harm self or others  LIFE CONTEXT: Family and Social: Taylor Bryant will be traveling to [place] to visit his father tomorrow and will be gone from the 15th until the 23rd.  School/Work: Taylor Bryant is unemployed and continues to struggle financially. Self-Care: Taylor Bryant is working on establishing more of a routine. Life Changes: [name] is concerned about his father who is 69 and recently had a health scare.   GOALS ADDRESSED: Patient will: 1.  Reduce symptoms of: anxiety and depression  2.  Increase knowledge and/or ability of: coping skills and stress reduction  3.  Demonstrate ability to: Increase healthy adjustment to current life circumstances and follow a consitent routine.  INTERVENTIONS: Interventions utilized: Clinician utilized cognitive behavioral therapy by discussing the client's irrational and rational thoughts. Clinician explained that she believes that his daughter's temper tantrums are due to the fact that she is four years old and is having a little bit of separation anxiety. Clinician explained that it's  too soon to tell whether or not his daughter will turn out exactly like him in terms of mental illness. Clinician explained that the difference between when he was involuntarily committed in [place]  and now is that the fact that he is no longer psychotic, is coming to therapy, taking his medications, and trying to make positive changes in his life. Clinician explained that he would have to be a harm to himself or others to be involuntarily committed with a doctor's order. Clinician explained that his ex wife can make a claim but if he is interviewed deemed confident and not a danger to himself then he will be released. Clinician explained that his wife cannot take his daughter to [place] if he does not financially assist with paying for the trip.  Standardized Assessments completed: GAD-7 and PHQ 9 completed. Anxiety increased by one point, and depression decreased by one point.   ASSESSMENT: Patient currently experiencing symptoms of anxiety and depression.   Patient may benefit from continuing with therapy weekly.  PLAN: 1. Follow up with behavioral health clinician on : two weeks on October 29th @ 12: 00 pm.  2. Behavioral recommendations: Continue to follow through with a routine. 3. Referral(s): Integrated [employer] (In Clinic) 4. "From scale of 1-10, how likely are you to follow plan?": 7  [name], LCSW

## 2017-10-23 ENCOUNTER — Ambulatory Visit: Payer: Self-pay | Admitting: Licensed Clinical Social Worker

## 2017-10-23 DIAGNOSIS — F411 Generalized anxiety disorder: Secondary | ICD-10-CM

## 2017-10-23 DIAGNOSIS — F410 Panic disorder [episodic paroxysmal anxiety] without agoraphobia: Secondary | ICD-10-CM

## 2017-10-23 DIAGNOSIS — F333 Major depressive disorder, recurrent, severe with psychotic symptoms: Secondary | ICD-10-CM

## 2017-10-23 NOTE — Progress Notes (Signed)
Recreated by mistake

## 2017-10-23 NOTE — Progress Notes (Cosign Needed)
Integrated Behavioral Health Treatment Planning Team  MRN: [id number] NAME: Taylor Bryant  DATE: 10/23/17  Taylor Bullocks, LCSW scribing for psychiatrist Dr. Mare Bryant. Case consultation with Dr. Mare Bryant on Wednesday October 10th @ 12 pm. Last Clinical comprehensive assessment received and reviewed from [facility] on June 6th of this year. Dr. Mare Bryant recommended that based on the results of the patient's initial assessment by her and from the one completed by another therapist at another facility that he remain on Cymbalta 60 mg daily for anxiety and Lithium 450 mg three times daily depression and psychotic features. A lab will need to be ordered to determine the patient's lithium level to record and document due to the affect that Lithium can have on the kidney's. Recommendation that Taylor Bryant continue to follow up for weekly therapy with Taylor Bullocks, LCSW for symptoms of depression, anxiety, and panic attacks. Patient was informed of case consultation and recommendations during today's session. Patient is agreeable to the plans. No further recommendations at this time.   Treatment Team Attendees: Dr. Mare Bryant, Taylor Bullocks, LCSW, and Teah Alecia Lemming NP.     Presenting Problem/Current Symptoms: Taylor Bryant reports that he woke up feeling depressed today and had a crying spell due to it being the anniversary of his mom's death. He explains that he is having trouble with his ability to relax. He notes that he feels slightly better anxiety wise now that an appointment with a [place] cardiologist has been made. He notes that he received a prescription savings card for his medications at [facility]. He explains that he feels like he can't be around his friends because he doesn't drink or do drugs anymore. He notes that he has a fear of saying no to his friends because rejection is hard. He notes that he feels like he has been out of control due having a total of 102 tattoos now. He notes  that when he moved to [place] in the last year that he started out with 27 or 28 tattoos. He explains that he only has the support of his daughter and ex wife. He describes when he is alone that he has racing thoughts about making poor choices. He denies suicidal and homicidal thoughts.   Diagnoses: Major Depressive Disorder with psychotic features Generalized Anxiety Disorder Panic Disorder without agoraphobia   Medical History:  Past Medical History:  Diagnosis Date  . Schizoaffective disorder (HCC)   . Von Willebrand disease (HCC)     Labs:  Recent Results (from the past 2160 hour(s))  Comprehensive metabolic panel     Status: None   Collection Time: 09/10/17  6:24 PM  Result Value Ref Range   Glucose 85 65 - 99 mg/dL   BUN 10 6 - 20 mg/dL   Creatinine, Ser 4.09 0.76 - 1.27 mg/dL   GFR calc non Af Amer 106 >59 mL/min/1.73   GFR calc Af Amer 122 >59 mL/min/1.73   BUN/Creatinine Ratio 11 9 - 20   Sodium 136 134 - 144 mmol/L   Potassium 4.5 3.5 - 5.2 mmol/L   Chloride 100 96 - 106 mmol/L   CO2 21 20 - 29 mmol/L   Calcium 9.0 8.7 - 10.2 mg/dL   Total Protein 8.0 6.0 - 8.5 g/dL   Albumin 4.6 3.5 - 5.5 g/dL   Globulin, Total 3.4 1.5 - 4.5 g/dL   Albumin/Globulin Ratio 1.4 1.2 - 2.2   Bilirubin Total 0.3 0.0 - 1.2 mg/dL   Alkaline Phosphatase 101 39 -  117 IU/L   AST 19 0 - 40 IU/L   ALT 17 0 - 44 IU/L  Lipid Profile     Status: Abnormal   Collection Time: 09/10/17  6:24 PM  Result Value Ref Range   Cholesterol, Total 249 (H) 100 - 199 mg/dL   Triglycerides 914 (H) 0 - 149 mg/dL   HDL 37 (L) >78 mg/dL   VLDL Cholesterol Cal 42 (H) 5 - 40 mg/dL   LDL Calculated 295 (H) 0 - 99 mg/dL   Chol/HDL Ratio 6.7 (H) 0.0 - 5.0 ratio    Comment:                                   T. Chol/HDL Ratio                                             Men  Women                               1/2 Avg.Risk  3.4    3.3                                   Avg.Risk  5.0    4.4                                 2X Avg.Risk  9.6    7.1                                3X Avg.Risk 23.4   11.0   TSH     Status: None   Collection Time: 09/10/17  6:24 PM  Result Value Ref Range   TSH 1.690 0.450 - 4.500 uIU/mL  Urinalysis, dipstick only     Status: None   Collection Time: 09/10/17  6:24 PM  Result Value Ref Range   Specific Gravity, UA 1.016 1.005 - 1.030   pH, UA 5.0 5.0 - 7.5   Color, UA Yellow Yellow   Appearance Ur Clear Clear   Leukocytes, UA Negative Negative   Protein, UA Negative Negative/Trace   Glucose, UA Negative Negative   Ketones, UA Negative Negative   RBC, UA Negative Negative   Bilirubin, UA Negative Negative   Urobilinogen, Ur 0.2 0.2 - 1.0 mg/dL   Nitrite, UA Negative Negative  HgB A1c     Status: None   Collection Time: 09/10/17  6:24 PM  Result Value Ref Range   Hgb A1c MFr Bld 5.6 4.8 - 5.6 %    Comment:          Prediabetes: 5.7 - 6.4          Diabetes: >6.4          Glycemic control for adults with diabetes: <7.0    Est. average glucose Bld gHb Est-mCnc 114 mg/dL    Procedures: Lithium level lab should be ordered to check Lithium levels.   Social History:  Social History   Socioeconomic History  . Marital status: Divorced    Spouse name: Not on file  .  Number of children: 1  . Years of education: Master's  . Highest education level: Master's degree (e.g., MA, MS, MEng, MEd, MSW, MBA)  Occupational History  . Occupation: Currently Unemployed  . Occupation: Previously Sport and exercise psychologist: [employer]    Comment: 2.5 years ago  Social Needs  . Financial resource strain: Very hard  . Food insecurity:    Worry: Often true    Inability: Sometimes true  . Transportation needs:    Medical: Yes    Non-medical: Yes  Tobacco Use  . Smoking status: Former Smoker    Types: Cigarettes  Substance and Sexual Activity  . Alcohol use: Yes  . Drug use: No  . Sexual activity: Not on file  Lifestyle  . Physical activity:    Days per week: Not on  file    Minutes per session: Not on file  . Stress: Not on file  Relationships  . Social connections:    Talks on phone: Not on file    Gets together: Not on file    Attends religious service: Not on file    Active member of club or organization: Not on file    Attends meetings of clubs or organizations: Not on file    Relationship status: Not on file  Other Topics Concern  . Not on file  Social History Narrative  . Not on file     Goals: Patient will: Increase healthy adjustment to current life circumstances follow a consitent routine. and reduce symptoms of: anxiety depression and also Increase knowledge and/or ability of:  coping skills stress reduction  Interventions: Brief CBT Psychoeducation and/or Health Education  Standardized Assessments Completed: GAD-7 PHQ 9  Medication History: Current medications:  Outpatient Encounter Medications as of 10/23/2017  Medication Sig  . atorvastatin (LIPITOR) 40 MG tablet Take 1 tablet (40 mg total) by mouth daily at 6 PM.   No facility-administered encounter medications on file as of 10/23/2017.     Psychotropic Medication Management:   1. Medication: Cymbalta 60 mg daily  Re-prescribed, previously taking medication at [facility] and pt report that medication was effective at treating his symptoms of Schizoaffective disorder.   Taking as prescribed: No - Patient has ran out of this medication since leaving [employer] in June of 2019.   Positive effects/relief of symptoms: Patient reports that this medications has decreased his symptoms of anxiety and depression in the past.   Negative side effects: None to report.   2.  Medication: Lithium 450 mg three times daily    Date started: Prior to treatment at [facility] in 2019.   Taking as prescribed: Yes  Positive effects/relief of symptoms: Patient reports that he is no longer having auditory and visual  hallucinations. Some paranoia believed to be related to anxiety.  Negative side effects: None to report.    Team Recommendations: See at top of page.   Referral(s): Integrated [facility] Services (In Clinic)  Follow up Appointments: in two weeks on October 29th @ 12 pm for psychotherapy with Taylor Bullocks, LCSW  Scribe for Treatment Team: [name], LCSW

## 2017-10-24 ENCOUNTER — Other Ambulatory Visit: Payer: Self-pay | Admitting: Adult Health Nurse Practitioner

## 2017-10-24 DIAGNOSIS — F203 Undifferentiated schizophrenia: Secondary | ICD-10-CM

## 2017-11-05 ENCOUNTER — Ambulatory Visit: Payer: Self-pay | Admitting: Licensed Clinical Social Worker

## 2017-11-05 DIAGNOSIS — F333 Major depressive disorder, recurrent, severe with psychotic symptoms: Secondary | ICD-10-CM

## 2017-11-05 DIAGNOSIS — F411 Generalized anxiety disorder: Secondary | ICD-10-CM

## 2017-11-05 DIAGNOSIS — F410 Panic disorder [episodic paroxysmal anxiety] without agoraphobia: Secondary | ICD-10-CM

## 2017-11-05 NOTE — BH Specialist Note (Signed)
Integrated Behavioral Health Follow Up Visit  MRN: [id number] Name: Taylor Bryant  Number of Integrated Behavioral Health Clinician visits: 6/6   Type of Service: Integrated Behavioral Health- Individual/Family Interpretor:No. Interpretor Name and Language: Not applicable.  SUBJECTIVE: Taylor Bryant is a 37 y.o. male accompanied by himself. Patient was referred by Taylor Bryant nurse practitioner for consult. Patient reports the following symptoms/concerns: He notes that the trip in [place] did not go well. He explains that his family gave him a hard time. He explains that he was quiet, wasn't talking much, defensive, moody, and became agitated easily is how his family describes his behavior to him. He explains that after he got back into [place] that his attitude continued. He notes that he became exteremly upset when he noticed around 1000 dollars come out of his account so he called the bank who said that they took back money he borrowed from them in the past. He notes that he called his sister for help and she became extremely upset with him.  He notes that his sister said horrible things to him and basically said that he had ruined his dad's life. He notes that his anxiety is high because he doesn't have the money to pay all the bills. He notes that the Cymbalta scares him and is making him feel more depressed.  He notes that he doesn't want to be on Cymbalta anymore. He explains that he has been taking Lithium three to four years and that's what's worked best for him.  Duration of problem:  ; Severity of problem: severe  OBJECTIVE: Mood: Depressed and Affect: Appropriate Risk of harm to self or others: No plan to harm self or others  LIFE CONTEXT: Family and Social: Taylor Bryant reports that his trip to [place] did not go well.  School/Work: Taylor Bryant has applied for disability and is waiting to hear a response.  Self-Care: Taylor Bryant reports that he has been laying around in his  bed the firs three to four days, hasn't really changed his clothes, isn't eating, and not feeling well.  Life Changes: Taylor Bryant is isolating himself away from others.  GOALS ADDRESSED: Patient will: 1.  Reduce symptoms of: depression  2.  Increase knowledge and/or ability of: healthy habits, self-management skills and stress reduction  3.  Demonstrate ability to: Increase healthy adjustment to current life circumstances  INTERVENTIONS: Interventions utilized:  Brief CBT was utilized by the clinician focusing on the patient's depression by discussing behavioral activation. Clinician processed with the patient regarding why his trip to [place] did not go well. Clinician discussed with the patient regarding how he has been feeling. Clinician explained that isolating himself in the bed for the past three days, not doing his hygiene, and not eating is not helping with his depression. Clinician explained to the patient that he cannot continue to dwell on everything that his sister has said to him because her goal is to upset him. Clinician explained that she realizes that financially he is struggling but asked him if he was honestly in a place that he is emotionally and mentally able to get a job. Standardized Assessments completed: next week.  ASSESSMENT: Patient currently experiencing symptoms of depression exacerbated by lack of routine, dwelling on things he is unable to change, and isolating himself away from others.   Patient may benefit from establishing a routine.  PLAN: 1. Follow up with behavioral health clinician on : in one week or earlier if needed.  2. Behavioral recommendations: Consult with Taylor Bryant on 10/30 3. Referral(s): Integrated [facility] (In Clinic) 4. "From scale of 1-10, how likely are you to follow plan?": 8  Taylor Forts, LCSW

## 2017-11-12 ENCOUNTER — Ambulatory Visit: Payer: Self-pay | Admitting: Licensed Clinical Social Worker

## 2017-11-12 ENCOUNTER — Other Ambulatory Visit: Payer: Self-pay

## 2017-11-12 DIAGNOSIS — F410 Panic disorder [episodic paroxysmal anxiety] without agoraphobia: Secondary | ICD-10-CM

## 2017-11-12 DIAGNOSIS — F411 Generalized anxiety disorder: Secondary | ICD-10-CM

## 2017-11-12 DIAGNOSIS — F203 Undifferentiated schizophrenia: Secondary | ICD-10-CM

## 2017-11-12 DIAGNOSIS — Z0000 Encounter for general adult medical examination without abnormal findings: Secondary | ICD-10-CM

## 2017-11-12 DIAGNOSIS — F333 Major depressive disorder, recurrent, severe with psychotic symptoms: Secondary | ICD-10-CM

## 2017-11-12 NOTE — BH Specialist Note (Signed)
Integrated Behavioral Health Follow Up Visit  MRN: [id number] Name: Taylor Bryant  Number of Integrated Behavioral Health Clinician visits: 7 Type of Service: Integrated Behavioral Health- Individual/Family Interpretor:No. Interpretor Name and Language: Not applicable.  SUBJECTIVE: Taylor Bryant is a 37 y.o. male accompanied by himself. Patient was referred by Taylor Pi NP for mental health. Patient reports the following symptoms/concerns:  He reports that he has been having a hard time lately. He explains that he has not been feeling well and experiencing a vertigo type feeling. He describes not sleeping well, not having much of an appetite, and crying spells everyday. He explains that he has decided that he has to get better in order to get a job and be there for his daughter. He denies suicidal and homicidal thoughts.  Duration of problem: several years; Severity of problem: severe  OBJECTIVE: Mood: Depressed and Affect: Appropriate Risk of harm to self or others: No plan to harm self or others  LIFE CONTEXT: Family and Social: Taylor Bryant reports that he would like to speak with his father before filling out a release of information. School/Work: See above. Self-Care: Taylor Bryant is not taking good care of himself. Life Changes: Taylor Bryant seems motivated to make lifestyle changes.   GOALS ADDRESSED: Patient will: 1.  Reduce symptoms of: depression and stress  2.  Increase knowledge and/or ability of: coping skills and self-management skills  3.  Demonstrate ability to: Increase healthy adjustment to current life circumstances  INTERVENTIONS: Interventions utilized:  Brief CBT was utilized by the clinician focusing on the patient's depression and affect on normal cognition. Clinician explained that it sounds like his argument with his ex wife was a motivation factor for him to change his life around to be there for his daughter. Clinician explained that she thinks he needs to work on  practicing self care and improving his mental health before he can think about getting a job.  Standardized Assessments completed: GAD-7 and PHQ 9 Depression increased by 1 point: 18 and anxiety decreased by 4 points: 18.  ASSESSMENT: Patient currently experiencing symptoms of depression exacerbated by situational stressros.   Patient may benefit from adhering to treatment recommendations.  PLAN: 1. Follow up with behavioral health clinician on : one week 2. Behavioral recommendations: Continue to adhere to plan. 3. Referral(s): Integrated [facility] (In Clinic) 4. "From scale of 1-10, how likely are you to follow plan?": 6  [name], LCSW

## 2017-11-13 LAB — CBC
Hematocrit: 39.8 % (ref 37.5–51.0)
Hemoglobin: 13.6 g/dL (ref 13.0–17.7)
MCH: 30.6 pg (ref 26.6–33.0)
MCHC: 34.2 g/dL (ref 31.5–35.7)
MCV: 89 fL (ref 79–97)
Platelets: 316 10*3/uL (ref 150–450)
RBC: 4.45 x10E6/uL (ref 4.14–5.80)
RDW: 14 % (ref 12.3–15.4)
WBC: 15.5 10*3/uL — ABNORMAL HIGH (ref 3.4–10.8)

## 2017-11-13 LAB — LITHIUM LEVEL: LITHIUM LVL: 0.8 mmol/L (ref 0.6–1.2)

## 2017-11-19 ENCOUNTER — Ambulatory Visit: Payer: Self-pay | Admitting: Licensed Clinical Social Worker

## 2017-11-19 ENCOUNTER — Other Ambulatory Visit: Payer: Self-pay | Admitting: Gerontology

## 2017-11-19 DIAGNOSIS — F410 Panic disorder [episodic paroxysmal anxiety] without agoraphobia: Secondary | ICD-10-CM

## 2017-11-19 DIAGNOSIS — F411 Generalized anxiety disorder: Secondary | ICD-10-CM

## 2017-11-19 DIAGNOSIS — F333 Major depressive disorder, recurrent, severe with psychotic symptoms: Secondary | ICD-10-CM

## 2017-11-19 MED ORDER — LITHIUM CARBONATE ER 450 MG PO TBCR
450.0000 mg | EXTENDED_RELEASE_TABLET | Freq: Three times a day (TID) | ORAL | 0 refills | Status: DC
Start: 2017-11-19 — End: 2017-11-28

## 2017-11-19 MED ORDER — DULOXETINE HCL 60 MG PO CPEP: 60.0000 mg | ORAL_CAPSULE | Freq: Every day | ORAL | 0 refills | Status: DC
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
## 2017-11-19 NOTE — Progress Notes (Signed)
Dr Mare FerrariLavine recommended Cymbalta 60 mg oral daily and Lithium 450 mg oral TID, reviewed Lithium level and cMET, medications was ordered. Mr Taylor Bryant will follow up with Psychiatry.

## 2017-11-20 ENCOUNTER — Other Ambulatory Visit: Payer: Self-pay | Admitting: Gerontology

## 2017-11-20 DIAGNOSIS — Z09 Encounter for follow-up examination after completed treatment for conditions other than malignant neoplasm: Secondary | ICD-10-CM

## 2017-11-28 ENCOUNTER — Ambulatory Visit: Payer: Self-pay | Admitting: Urology

## 2017-11-28 ENCOUNTER — Encounter: Payer: Self-pay | Admitting: Licensed Clinical Social Worker

## 2017-11-28 ENCOUNTER — Ambulatory Visit: Payer: Self-pay | Admitting: Licensed Clinical Social Worker

## 2017-11-28 VITALS — BP 122/69 | HR 58 | Temp 98.2°F | Ht 70.0 in | Wt 190.3 lb

## 2017-11-28 DIAGNOSIS — E785 Hyperlipidemia, unspecified: Secondary | ICD-10-CM

## 2017-11-28 DIAGNOSIS — F333 Major depressive disorder, recurrent, severe with psychotic symptoms: Secondary | ICD-10-CM

## 2017-11-28 DIAGNOSIS — F411 Generalized anxiety disorder: Secondary | ICD-10-CM

## 2017-11-28 MED ORDER — ATORVASTATIN CALCIUM 40 MG PO TABS: 40.0000 mg | ORAL_TABLET | Freq: Every day | ORAL | 3 refills | Status: DC

## 2017-11-28 MED ORDER — DULOXETINE HCL 60 MG PO CPEP: 60.0000 mg | ORAL_CAPSULE | Freq: Every day | ORAL | 0 refills | Status: DC

## 2017-11-28 MED ORDER — LITHIUM CARBONATE ER 450 MG PO TBCR: 450.0000 mg | EXTENDED_RELEASE_TABLET | Freq: Three times a day (TID) | ORAL | 0 refills | Status: DC

## 2017-11-28 NOTE — BH Specialist Note (Signed)
Integrated Behavioral Health Follow Up Visit  MRN: [id number] Name: Taylor Bryant  Type of Service: Integrated Behavioral Health- Individual/Family Interpretor:No. Interpretor Name and Language: Not applicable.  SUBJECTIVE: Taylor Bryant is a 37 y.o. male accompanied by himself. Patient was referred by [name] for mental health. Patient reports the following symptoms/concerns: He reports that he is doing a little bit better. He explains that after talking with her last week that he decided to take her advice and set a healthy boundary with his ex wife. He explains that he told her to back off. He explains that he told her that he would support their daughter financially and that was it. He reports that he also has cut out negative friends and those who are not supporting him. He explains that its difficult to say goodbye to his friends but he knows that its what is best for him. He notes that he is trying to be more positive and talks with his dad everyday. He notes that he has been off of his medications for six days because he can't afford it. He notes that he also relapsed on Xanax the other day. He explains that the anxiety is worsening and thinks he needs to be on something for it. He denies suicidal and homicidal thoughts.  Duration of problem: several years; Severity of problem: moderate  OBJECTIVE: Mood: Euthymic and Affect: Appropriate Risk of harm to self or others: No plan to harm self or others  LIFE CONTEXT: Family and Social: [name] has set healthy boundaries with his ex wife. He talks with his dad everyday. School/Work: [name] is not working right now and is thinking of applying for disability. Self-Care: [name] is trying to strengthen his relationship with his daughter. Life Changes: [name] has not taken his medications for six days due to being unable to afford it.   GOALS ADDRESSED: Patient will: 1.  Reduce symptoms of: anxiety and depression  2.  Increase  knowledge and/or ability of: self-management skills  3.  Demonstrate ability to: Increase healthy adjustment to current life circumstances  INTERVENTIONS: Interventions utilized:  Brief CBT was utilized by the clinician focusing on the patient's mood and affect on normal cognition. Clinician processed with the patient regarding how he has been doing since the last follow up session. Clinician explained that she is glad to hear that he decided to set healthy boundaries with his ex wife. Clinician explained that it seems like these friends were only bringing him down rather than lifting him up so she thinks he made the right decision. Clinician expressed concern that he has been with his psychotropic medications for the last week. Clinician provided the patient with good rx cards to get his medications for under 20.00 at [facility] and told him to call his pharmacy to have the prescriptions transferred. Clinician updated the patient's pharmacy in the system. Clinician explained that if he were to get all the documents for medication management that he would be able to get his medications for free.  Standardized Assessments completed: next session.  ASSESSMENT: Patient currently experiencing symptoms of depression and anxiety exacerbated by situational stressors and being off of his medications.   Patient may benefit from continuing with therapy and adhering to the plan of care.  PLAN: 1. Follow up with behavioral health clinician on : two weeks or earlier if needed. 2. Behavioral recommendations: Will consult with Dr. Mare Ferrari regarding anxiety. 3. Referral(s): Integrated [facility] (In Clinic) 4. "From scale  of 1-10, how likely are you to follow plan?": 8  Althia FortsHeather B Tieasha Larsen, LCSW

## 2017-11-28 NOTE — Progress Notes (Signed)
Patient: Taylor Bryant Male    DOB: 08-Jul-1980   37 y.o.   MRN: [id number] Visit Date: 11/28/2017  Today's Provider: Michiel Cowboy, PA-C   Chief Complaint  Patient presents with  . Follow-up    Does not like taking cymbalta due to weight gain associated with the medication.  Had a good therapy session and is feeling positive about trying psych drugs again.     Subjective:    HPI Patient was found to have high cholesterol with his labs in 09/2017.  He states he was eating badly.  He has been eating more healthy over the last two weeks.  Taking Lipitor 40 mg daily; refilled today.  He has a personal history of a congenital heart condition. ?trouble with aortic valve  Was told he would need surgery by the time he was 38.   He is seeing cardiology in 12/2017.    He was seen by dentist and had a deep cleaning and completed a course of Clindamycin x 2.  He feels that he still has infections in his gums.  He has an appointment with them on 11/29 for follow up.    WBC count was found to be 15.5 on 11/12/2017.  Scheduled for repeat labs on 11/26.      Allergies  Allergen Reactions  . Penicillins Rash   Previous Medications   ATORVASTATIN (LIPITOR) 40 MG TABLET    Take 1 tablet (40 mg total) by mouth daily at 6 PM.   DULOXETINE (CYMBALTA) 60 MG CAPSULE    Take 1 capsule (60 mg total) by mouth daily.   LITHIUM CARBONATE (ESKALITH) 450 MG CR TABLET    Take 1 tablet (450 mg total) by mouth 3 (three) times daily.    Review of Systems  HENT: Positive for dental problem and facial swelling.   Cardiovascular: Positive for chest pain.       X 1 year; attributes to panic attacks and has upcoming appointment with cardiology   All other systems reviewed and are negative.   Social History   Tobacco Use  . Smoking status: Former Smoker    Types: Cigarettes    Last attempt to quit: 2016    Years since quitting: 3.8  Substance Use Topics  . Alcohol use: Not Currently   Objective:   BP 122/69 (BP  Location: Left Arm)   Pulse (!) 59   Temp 98.2 F (36.8 C)   Ht 5\' 10"  (1.778 m)   Wt 190 lb (86.2 kg)   BMI 27.26 kg/m   Physical Exam  Constitutional: He is oriented to person, place, and time. He appears well-developed and well-nourished.  HENT:  Head: Normocephalic and atraumatic.  Right Ear: External ear normal.  Left Ear: External ear normal.  Nose: Nose normal.  Mouth/Throat: Uvula is midline, oropharynx is clear and moist and mucous membranes are normal.  Gingivitis presents in the upper gums  Eyes: Pupils are equal, round, and reactive to light. Conjunctivae and EOM are normal.  Neck: Normal range of motion. Neck supple.  Cardiovascular: Normal rate, regular rhythm, normal heart sounds and intact distal pulses.  Pulmonary/Chest: Effort normal and breath sounds normal.  Musculoskeletal: Normal range of motion.  Neurological: He is alert and oriented to person, place, and time.  Skin: Skin is warm and dry. There is erythema.        Assessment & Plan:  1. Heart condition  Has upcoming appointment with cardiology in 12/2017   Continue Lipitor - refills given  2. Schizophrenia  Managed with behavioral medicine  Restarting cymbalta and lithium   3. Elevated WBC   Recheck labs on 11/26  4. Gingivitis  Has upcoming appointment with dentist on 11/20       Michiel CowboySHANNON Rayce Brahmbhatt, PA-C   [facility] [place]

## 2017-12-10 ENCOUNTER — Encounter: Payer: Self-pay | Admitting: Cardiovascular Disease

## 2017-12-10 ENCOUNTER — Ambulatory Visit (INDEPENDENT_AMBULATORY_CARE_PROVIDER_SITE_OTHER): Payer: Self-pay | Admitting: Cardiovascular Disease

## 2017-12-10 VITALS — BP 128/81 | HR 76 | Ht 70.0 in | Wt 188.0 lb

## 2017-12-10 DIAGNOSIS — J45909 Unspecified asthma, uncomplicated: Secondary | ICD-10-CM

## 2017-12-10 DIAGNOSIS — R0602 Shortness of breath: Secondary | ICD-10-CM

## 2017-12-10 DIAGNOSIS — F203 Undifferentiated schizophrenia: Secondary | ICD-10-CM

## 2017-12-10 DIAGNOSIS — Q231 Congenital insufficiency of aortic valve: Secondary | ICD-10-CM

## 2017-12-10 DIAGNOSIS — Z8679 Personal history of other diseases of the circulatory system: Secondary | ICD-10-CM

## 2017-12-10 DIAGNOSIS — D6800 Von Willebrand disease, unspecified: Secondary | ICD-10-CM

## 2017-12-10 DIAGNOSIS — R011 Cardiac murmur, unspecified: Secondary | ICD-10-CM

## 2017-12-10 NOTE — Progress Notes (Signed)
Cardiology Office Note  Date:  12/10/2017   ID:  Taylor Bryant, DOB 11/16/1980, MRN [id number]  PCP:  System, Pcp Not In   Chief Complaint  Patient presents with  . OTHER    VWD c/o sob with exertion and dizziness. Meds reviewed verbally with pt.    HPI:  [name] is a [age] old gentleman with past medical history of Anxiety disorder/Panic disorder Depression/schizophrenia Bipolar disorder Hyperlipidemia Smoker Von Willebrand Disease [facility] , Teah Doles-Johson,  for possible congenital heart condition, aortic valve disease  He reports that he was born with a heart murmur At age 37 he was evaluated by cardiology in [place] Told he had bicuspid aortic valve This was confirmed by transesophageal echo Was evaluated by a surgeon at the time and was told that if he continued with his bad habits including drinking, smoking that he would need surgery at age 37  Reports that he no longer smokes or drinks Recently started on cholesterol medication for hyperlipidemia, total cholesterol as detailed below  Denies significant chest pain but reports having poor exercise tolerance Unable to do regular exercise, climb stairs Has some shortness of breath, exercise fatigue Poor endurance  Lab work reviewed with him in detail Hemoglobin A1c 5.6 Total cholesterol 250, LDL 170  EKG personally reviewed by myself on todays visit Shows normal sinus rhythm rate 76 bpm no significant ST or T wave changes  PMH:   has a past medical history of Bicuspid aortic valve, Heart murmur, Hyperlipidemia, Schizoaffective disorder (HCC), and Von Willebrand disease (HCC).  PSH:    Past Surgical History:  Procedure Laterality Date  . TOOTH EXTRACTION      Current Outpatient Medications  Medication Sig Dispense Refill  . atorvastatin (LIPITOR) 40 MG tablet Take 1 tablet (40 mg total) by mouth daily at 6 PM. 30 tablet 3  . DULoxetine (CYMBALTA) 60 MG capsule Take 1 capsule (60 mg  total) by mouth daily. 30 capsule 0  . lithium carbonate (ESKALITH) 450 MG CR tablet Take 1 tablet (450 mg total) by mouth 3 (three) times daily. 90 tablet 0  . NON FORMULARY CBD tablet qd.     No current facility-administered medications for this visit.      Allergies:   Penicillins   Social History:  The patient  reports that he quit smoking about 3 years ago. His smoking use included cigarettes. He has a 8.00 pack-year smoking history. He has never used smokeless tobacco. He reports that he drank alcohol. He reports that he does not use drugs.   Family History:   family history includes Heart Problems in his father.    Review of Systems: Review of Systems  Constitutional: Negative.   Respiratory: Positive for shortness of breath.   Cardiovascular: Negative.   Gastrointestinal: Negative.   Musculoskeletal: Negative.   Neurological: Negative.   Psychiatric/Behavioral: The patient is nervous/anxious.   All other systems reviewed and are negative.    PHYSICAL EXAM: VS:  BP 128/81 (BP Location: Right Arm, Patient Position: Sitting, Cuff Size: Normal)   Pulse 76   Ht 5\' 10"  (1.778 m)   Wt 188 lb (85.3 kg)   BMI 26.98 kg/m  , BMI Body mass index is 26.98 kg/m.  Tattoos over his body GEN: Well nourished, well developed, in no acute distress  HEENT: normal  Neck: no JVD, carotid bruits, or masses Cardiac: RRR; 2/6 diastolic murmur right sternal border, 1/6 to 2/6 systolic ejection murmur heard left sternal  border No rubs, or gallops,no edema  Respiratory:  clear to auscultation bilaterally, normal work of breathing GI: soft, nontender, nondistended, + BS MS: no deformity or atrophy  Skin: warm and dry, no rash Neuro:  Strength and sensation are intact Psych: euthymic mood, full affect   Recent Labs: 09/10/2017: ALT 17; BUN 10; Creatinine, Ser 0.91; Potassium 4.5; Sodium 136; TSH 1.690 11/12/2017: Hemoglobin 13.6; Platelets 316    Lipid Panel Lab Results  Component  Value Date   CHOL 249 (H) 09/10/2017   HDL 37 (L) 09/10/2017   LDLCALC 170 (H) 09/10/2017   TRIG 210 (H) 09/10/2017      Wt Readings from Last 3 Encounters:  12/10/17 188 lb (85.3 kg)  11/28/17 190 lb (86.2 kg)  11/28/17 190 lb 4.8 oz (86.3 kg)       ASSESSMENT AND PLAN:  Undifferentiated schizophrenia (HCC) 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 care Appropriate behavior on today's visit  History of aortic valve disorder - Plan: EKG 12-Lead Told he had aortic valve is a child, work-up at that time age 37 confirming with transesophageal echo We have ordered echocardiogram for confirmation and to evaluate valve disease Reports having some shortness of breath with exertion Will evaluate ejection fraction  Von Willebrand disease (HCC) 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 care  Uncomplicated asthma, unspecified asthma severity, unspecified whether persistent Currently not using inhalers Does not feel that he has been having asthma exacerbations requiring treatment  Disposition:   We will call him with the results of his echocardiogram   Total encounter time more than 45 minutes  Greater than 50% was spent in counseling and coordination of care with the patient   Orders Placed This Encounter  Procedures  . EKG 12-Lead     Signed, Dossie Arbour, M.D., Ph.D. 12/10/2017  [facility] [place], [place] [phone]

## 2017-12-10 NOTE — Patient Instructions (Addendum)
Medication Instructions:  No changes  If you need a refill on your cardiac medications before your next appointment, please call your pharmacy.    Lab work: No new labs needed   If you have labs (blood work) drawn today and your tests are completely normal, you will receive your results only by: Marland Kitchen. MyChart Message (if you have MyChart) OR . A paper copy in the mail If you have any lab test that is abnormal or we need to change your treatment, we will call you to review the results.   Testing/Procedures: We will order an echocardiogram for murmur and bicuspid aortic valve, SOB   Follow-Up: At [facility], you and your health needs are our priority.  As part of our continuing mission to provide you with exceptional heart care, we have created designated Provider Care Teams.  These Care Teams include your primary Cardiologist (physician) and Advanced Practice Providers (APPs -  Physician Assistants and Nurse Practitioners) who all work together to provide you with the care you need, when you need it.  . You will need a follow up appointment as needed   . Providers on your designated Care Team:   . Nicolasa Duckinghristopher Berge, NP . Eula Listenyan Dunn, PA-C . Marisue IvanJacquelyn Visser, PA-C  Any Other Special Instructions Will Be Listed Below (If Applicable).  For educational health videos Log in to : [email or web address] Or : [email or web address], password : [email or web address]

## 2017-12-11 ENCOUNTER — Ambulatory Visit: Payer: Self-pay | Admitting: Licensed Clinical Social Worker

## 2017-12-11 DIAGNOSIS — F333 Major depressive disorder, recurrent, severe with psychotic symptoms: Secondary | ICD-10-CM

## 2017-12-11 DIAGNOSIS — F411 Generalized anxiety disorder: Secondary | ICD-10-CM

## 2017-12-11 DIAGNOSIS — F410 Panic disorder [episodic paroxysmal anxiety] without agoraphobia: Secondary | ICD-10-CM

## 2017-12-11 NOTE — BH Specialist Note (Signed)
Integrated Behavioral Health Follow Up Visit  MRN: [id number] Name: Taylor Bryant   Type of Service: Integrated Behavioral Health- Individual/Family Interpretor:No. Interpretor Name and Language: Not applicable.  SUBJECTIVE: Taylor Bryant is a 37 y.o. male accompanied by herself. Patient was referred by Jacelyn Pieah Doles Johnson NP for mental health. Patient reports the following symptoms/concerns: He reports that he has been taking his medicine again. He notes that he still feels depressed but not to the extent that he was. He notes that he is no longer having constant crying spells, is taking better care of Taylor Bryant, eating, and his hygiene. He notes that his anxiety is worse. He explains that he is having problems sleeping at night because his brain will not shut off. He describes racing thoughts about the past, his family, and things he cannot change. He denies experiencing any auditory or visual hallucinations. He reports that his fear is that when he travels to [place] on December 15th that his symptoms of depression and anxiety will return. He denies suicidal and homicidal thoughts.  Duration of problem: ; Severity of problem: moderate  OBJECTIVE: Mood: Anxious and Affect: Appropriate Risk of harm to self or others: No plan to harm self or others  LIFE CONTEXT: Family and Social: Taylor Bryant reports that he has set healthy boundaries with his ex wife and his family. School/Work: Taylor Bryant reports that he knows that he needs to get better in order to get a job. Self-Care: Taylor Bryant is taking his psychotropic medications again. He is eating and doing his hygiene daily. Life Changes: Taylor Bryant is experiencing more anxiety that usual.   GOALS ADDRESSED: Patient will: 1.  Reduce symptoms of: anxiety  2.  Increase knowledge and/or ability of: coping skills and self-management skills  3.  Demonstrate ability to: Increase healthy adjustment to current life circumstances  INTERVENTIONS: Interventions  utilized:  Brief CBT was utilized by the clinician focusing on anxiety and affect on normal cognition. Clinician processed with the patient regarding how he has been doing since the last follow up session. Clinician explained that she is glad to hear that he is taking his medications again. Clinician explained that she can noticed a difference compared to his last few visits until today. Clinician explained that she spoke with psychiatric consultant Dr. Mare FerrariLavine who recommended that his Cymbalta be increased from 60 mg to 90 mg daily for symptoms for increasing anxiety symptoms. Clinician explained that if he is keeping Taylor Bryant active and busy then he won't have as much times for his thoughts to wonder at night. Clinician discussed the concept of sleep hygiene to the patient to improve quality of sleep.  Standardized Assessments completed: GAD-7 and PHQ 9  Anxiety has decreased from 18 to 13 on GAD 7 and depression has decreased from 18 to 13 on PHQ 9.   ASSESSMENT: Patient currently experiencing symptoms of anxiety exacerbated by situational stressors.   Patient may benefit from adhering to plan of treatment.  PLAN: 1. Follow up with behavioral health clinician on : one week or earlier if needed. 2. Behavioral recommendations: Case consultation with Dr. Mare FerrariLavine on 12/11/17 where he recommended an increase in the Cymbalta from 60 mg daily to 90 mg daily. No further changes to medications or recommendations at this point in time.  3. Referral(s): [facility] 4. "From scale of 1-10, how likely are you to follow plan?": 8  Althia FortsHeather B Simpson, LCSW

## 2017-12-11 NOTE — Addendum Note (Signed)
Addended by: Bryna ColanderALLEN, Octaviano Mukai S on: 12/11/2017 11:07 AM   Modules accepted: Orders

## 2017-12-12 ENCOUNTER — Ambulatory Visit: Payer: Self-pay | Admitting: Licensed Clinical Social Worker

## 2017-12-12 DIAGNOSIS — F333 Major depressive disorder, recurrent, severe with psychotic symptoms: Secondary | ICD-10-CM

## 2017-12-12 DIAGNOSIS — F410 Panic disorder [episodic paroxysmal anxiety] without agoraphobia: Secondary | ICD-10-CM

## 2017-12-12 DIAGNOSIS — F411 Generalized anxiety disorder: Secondary | ICD-10-CM

## 2017-12-12 NOTE — BH Specialist Note (Signed)
Integrated Behavioral Health Treatment Planning Team Case consultation with Dr. Mare FerrariLavine, MD psychiatric consultant on 12/11/17 recommended that Mr. Siwik's Duloxetine (cymbalta) be increased from 60 mg to 90 mg due to patient report of increase in his symptoms of anxiety. He is continue to take his Lithium 450 mg three times daily for depression and psychotic symptoms. Patient was agreeable to the plans. No further recommendations at this point in time. Medications to [facility] [place] [place].  MRN: [id number] NAME: Taylor Bryant  DATE: 12/12/17  Treatment Team Attendees: Dr. Mare FerrariLavine, MD psychiatric consultant, Carey BullocksHeather Juron Vorhees, LCSW, and Jacelyn Pieah Doles Johnson, NP.     Presenting Problem/Current Symptoms: He reports that he has been taking his medicine again. He notes that he still feels depressed but not to the extent that he was. He notes that he is no longer having constant crying spells, is taking better care of himself, eating, and his hygiene. He notes that his anxiety is worse. He explains that he is having problems sleeping at night because his brain will not shut off. He describes racing thoughts about the past, his family, and things he cannot change. He denies experiencing any auditory or visual hallucinations. He reports that his fear is that when he travels to [place] on December 15th that his symptoms of depression and anxiety will return. He denies suicidal and homicidal thoughts.   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 lives alone. He is unemployed. He co parents his four year old daughter with his ex wife.   Medical History:  Past Medical History:  Diagnosis Date  . Bicuspid aortic valve   . Heart murmur   . Hyperlipidemia   . Schizoaffective disorder (HCC)   . Von Willebrand disease (HCC)     Labs:  Recent Results (from the past 2160 hour(s))  Lithium level      Status: None   Collection Time: 11/12/17  7:45 PM  Result Value Ref Range   Lithium Lvl 0.8 0.6 - 1.2 mmol/L    Comment:                                  Detection Limit = 0.1                           <0.1 indicates None Detected   CBC     Status: Abnormal   Collection Time: 11/12/17  7:45 PM  Result Value Ref Range   WBC 15.5 (H) 3.4 - 10.8 x10E3/uL   RBC 4.45 4.14 - 5.80 x10E6/uL   Hemoglobin 13.6 13.0 - 17.7 g/dL   Hematocrit 40.939.8 81.137.5 - 51.0 %   MCV 89 79 - 97 fL   MCH 30.6 26.6 - 33.0 pg   MCHC 34.2 31.5 - 35.7 g/dL   RDW 91.414.0 78.212.3 - 95.615.4 %   Platelets 316 150 - 450 x10E3/uL    Procedures: Recommendation to increase Cymbalta from 60 mg to 90 mg for increasing symptoms of anxiety. Patient was agreeable to the plans.   Social History:  Social History   Socioeconomic History  . Marital status: Divorced    Spouse name: Not on file  . Number of children: 1  . Years of education: Master's  . Highest education level: Master's degree (e.g., MA, MS, MEng, MEd, MSW, MBA)  Occupational History  .  Occupation: Currently Unemployed  . Occupation: Previously Sport and exercise psychologist: [employer]    Comment: 2.5 years ago  Social Needs  . Financial resource strain: Very hard  . Food insecurity:    Worry: Often true    Inability: Often true  . Transportation needs:    Medical: No    Non-medical: No  Tobacco Use  . Smoking status: Former Smoker    Packs/day: 0.50    Years: 16.00    Pack years: 8.00    Types: Cigarettes    Last attempt to quit: 2016    Years since quitting: 3.9  . Smokeless tobacco: Never Used  Substance and Sexual Activity  . Alcohol use: Not Currently  . Drug use: No  . Sexual activity: Not Currently  Lifestyle  . Physical activity:    Days per week: 1 day    Minutes per session: 120 min  . Stress: Very much  Relationships  . Social connections:    Talks on phone: More than three times a week    Gets together: Twice a week    Attends  religious service: Not on file    Active member of club or organization: Not on file    Attends meetings of clubs or organizations: Not on file    Relationship status: Not on file  Other Topics Concern  . Not on file  Social History Narrative  . Not on file    School/Education: Master's degree in [place]  Notable from [employer]: Mood: Anxious and Affect: Appropriate Risk of harm to self or others: No plan to harm self or others  Previous Treatment: [employer] and multiple hospitalizations. Last Hospitalization in 2016.   Treatment Barriers: Finances.  Strengths/Protective Factors: Mr. Nghiem has the support of his father and his daughter. His motivation for change is good at this point in time.   Goals: Patient will: Increase healthy adjustment to current life circumstances and reduce symptoms of: anxiety and also Increase knowledge and/or ability of:  coping skills self-management skills  Interventions: Brief CBT  Standardized Assessments Completed: GAD-7 PHQ 9 Anxiety has decreased from 18 to 13 on GAD 7 and depression has decreased from 18 to 13 on PHQ 9.   Medication History: Current medications:  Outpatient Encounter Medications as of 12/12/2017  Medication Sig  . atorvastatin (LIPITOR) 40 MG tablet Take 1 tablet (40 mg total) by mouth daily at 6 PM.  . DULoxetine (CYMBALTA) 60 MG capsule Take 1 capsule (60 mg total) by mouth daily.  Marland Kitchen lithium carbonate (ESKALITH) 450 MG CR tablet Take 1 tablet (450 mg total) by mouth 3 (three) times daily.  . NON FORMULARY CBD tablet qd.   No facility-administered encounter medications on file as of 12/12/2017.     Psychotropic Medication Management:   1. Medication: Cymbalta   Indication: Increasing symptoms of anxiety  Date started:   Date(s) changed: 12/11/17  Taking as prescribed: Yes  Positive effects/relief of symptoms: helps with symptoms of depression.  Negative side effects: none.  2.  Medication:  Lithium Carbonate 450 mg three times daily  Indication: Symptoms of depression and psychosis  Date started: years ago  Date(s) changed: no changes.  Taking as prescribed: Yes  Positive effects/relief of symptoms: keeps psychotic symptoms at bay  Negative side effects: none   Team Recommendations: See above.  Referral(s): Integrated Behavioral Health Services (In Clinic)  Who else would benefit from hearing team recommendations? No one at this point in  time.  Follow up Appointments: Follow up in one week or earlier if needed.   Scribe for Treatment Team: [name], LCSW

## 2017-12-17 ENCOUNTER — Ambulatory Visit: Payer: Self-pay | Admitting: Licensed Clinical Social Worker

## 2017-12-17 DIAGNOSIS — F410 Panic disorder [episodic paroxysmal anxiety] without agoraphobia: Secondary | ICD-10-CM

## 2017-12-17 DIAGNOSIS — F333 Major depressive disorder, recurrent, severe with psychotic symptoms: Secondary | ICD-10-CM

## 2017-12-17 DIAGNOSIS — F411 Generalized anxiety disorder: Secondary | ICD-10-CM

## 2017-12-17 NOTE — BH Specialist Note (Signed)
Integrated Behavioral Health Follow Up Visit  MRN: [id number] Name: Taylor PatriciaLuis Zobel   Type of Service: Integrated Behavioral Health- Individual/Family Interpretor:No. Interpretor Name and Language: Not applicable.  SUBJECTIVE: Taylor Bryant is a 37 y.o. male accompanied by himself. Patient was referred by Jacelyn Pieah Doles Johnson NP @ [facility] for mental health.  Patient reports the following symptoms/concerns: He reports that his mood has improved. He explains that he is not having racing thoughts, is sleeping better, and is no longer lying in bed all day. He explains that he can notice a difference with the medication versus without it. He notes that he worries about the tremors in his hands. He explains that the other reason he is anxious is because he spent a lot of money on christmas presents for his daughter. He notes that he is trying to work on being less anxious and not being as paranoid. He denies auditory and visual hallucinations. He denies suicidal and homicidal thoughts.  Duration of problem: ; Severity of problem: moderate  OBJECTIVE: Mood: Anxious and Affect: Appropriate Risk of harm to self or others: No plan to harm self or others  LIFE CONTEXT: Family and Social: Taylor Bryant reports that he is looking forward to spending a few weeks with his father for Christmas. School/Work: Taylor Bryant plans to start applying for jobs in the new year. Self-Care: Taylor Bryant is adhering to taking his medications. Life Changes: Taylor Bryant is trying to stay away from negative people.  GOALS ADDRESSED: Patient will: 1.  Reduce symptoms of: anxiety  2.  Increase knowledge and/or ability of: coping skills and self-management skills  3.  Demonstrate ability to: Increase healthy adjustment to current life circumstances  INTERVENTIONS: Interventions utilized:  Brief CBT was utilized focusing on anxiety and affect on normal cognition. Clinician processed with the patient regarding how she has been doing since  the last follow up session. Clinician discussed with the patient regarding how his overall mood has been. Clinician explained that she will talk with Dr. Mare FerrariLavine, MD tomorrow during their consultation about the concerns for tremors in his hands. Clinician explained that in terms of being impulsive with money to only have a limited amount of money on him for specific things so he doesn't have the temptation to over spend. Clinician encouraged the patient to continue to take his medications. Clinician discussed with the patient regarding utilizing coping skills discussed at previous follow up sessions.  Standardized Assessments completed: Not Needed  ASSESSMENT: Patient currently experiencing symptoms of anxiety exacerbated by situational stressors.   Patient may benefit from continuing with therapy.  PLAN: 1. Follow up with behavioral health clinician on : January 2nd or earlier if needed. 2. Behavioral recommendations: Focus on positive rather than negative. Being less impulsive, not taking bank card or only having a limited amount of cash if he leaves the house so to not have the temptation to over spend. 3. Referral(s): [facility] 4. "From scale of 1-10, how likely are you to follow plan?": 10  Althia FortsHeather B Simpson, LCSW

## 2017-12-18 ENCOUNTER — Other Ambulatory Visit: Payer: Self-pay

## 2017-12-18 DIAGNOSIS — F333 Major depressive disorder, recurrent, severe with psychotic symptoms: Secondary | ICD-10-CM

## 2017-12-18 DIAGNOSIS — F411 Generalized anxiety disorder: Secondary | ICD-10-CM

## 2017-12-18 MED ORDER — LITHIUM CARBONATE ER 450 MG PO TBCR: 450.0000 mg | EXTENDED_RELEASE_TABLET | Freq: Three times a day (TID) | ORAL | 2 refills | Status: DC

## 2017-12-18 MED ORDER — DULOXETINE HCL 30 MG PO CPEP: 90.0000 mg | ORAL_CAPSULE | Freq: Every day | ORAL | 0 refills | Status: DC

## 2017-12-19 ENCOUNTER — Other Ambulatory Visit: Payer: Self-pay | Admitting: Adult Health Nurse Practitioner

## 2017-12-19 DIAGNOSIS — F411 Generalized anxiety disorder: Secondary | ICD-10-CM

## 2017-12-19 DIAGNOSIS — F333 Major depressive disorder, recurrent, severe with psychotic symptoms: Secondary | ICD-10-CM

## 2018-01-06 ENCOUNTER — Ambulatory Visit (INDEPENDENT_AMBULATORY_CARE_PROVIDER_SITE_OTHER): Payer: Self-pay

## 2018-01-06 ENCOUNTER — Other Ambulatory Visit: Payer: Self-pay

## 2018-01-06 DIAGNOSIS — R0602 Shortness of breath: Secondary | ICD-10-CM

## 2018-01-06 DIAGNOSIS — Q231 Congenital insufficiency of aortic valve: Secondary | ICD-10-CM

## 2018-01-06 DIAGNOSIS — R011 Cardiac murmur, unspecified: Secondary | ICD-10-CM

## 2018-01-09 ENCOUNTER — Ambulatory Visit: Payer: Self-pay | Admitting: Licensed Clinical Social Worker

## 2018-01-09 DIAGNOSIS — F410 Panic disorder [episodic paroxysmal anxiety] without agoraphobia: Secondary | ICD-10-CM

## 2018-01-09 DIAGNOSIS — F333 Major depressive disorder, recurrent, severe with psychotic symptoms: Secondary | ICD-10-CM

## 2018-01-09 DIAGNOSIS — F411 Generalized anxiety disorder: Secondary | ICD-10-CM
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
## 2018-01-15 ENCOUNTER — Ambulatory Visit: Payer: Self-pay | Admitting: Licensed Clinical Social Worker

## 2018-01-15 DIAGNOSIS — F333 Major depressive disorder, recurrent, severe with psychotic symptoms: Secondary | ICD-10-CM

## 2018-01-15 DIAGNOSIS — F410 Panic disorder [episodic paroxysmal anxiety] without agoraphobia: Secondary | ICD-10-CM

## 2018-01-15 DIAGNOSIS — F411 Generalized anxiety disorder: Secondary | ICD-10-CM
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
## 2018-01-22 ENCOUNTER — Ambulatory Visit: Payer: Self-pay | Admitting: Licensed Clinical Social Worker

## 2018-01-22 DIAGNOSIS — F411 Generalized anxiety disorder: Secondary | ICD-10-CM

## 2018-01-22 DIAGNOSIS — F410 Panic disorder [episodic paroxysmal anxiety] without agoraphobia: Secondary | ICD-10-CM

## 2018-01-22 DIAGNOSIS — F333 Major depressive disorder, recurrent, severe with psychotic symptoms: Secondary | ICD-10-CM

## 2018-01-22 NOTE — BH Specialist Note (Signed)
Integrated Behavioral Health Follow Up Visit  MRN: [id number] Name: Taylor Bryant   Type of Service: Integrated Behavioral Health- Individual/Family Interpretor:No. Interpretor Name and Language: Not applicable.  SUBJECTIVE: Taylor Bryant is a 38 y.o. male accompanied by himself. Patient was referred by Jacelyn Pi, NP at [facility] for mental health Patient reports the following symptoms/concerns: He reports that things are up and down. He explains that he has been struggling with panic attacks. He describes symptoms of hypomania as evidenced by being a little bit more social, outgoing, and paranoia. He describes having auditory and visual hallucinations, called his dad and his dad just told him he was fine but needed to rest. He notes that he has noticed a difference in his symptoms of depression with the increase in his Cymbalta. He notes that sometimes he feels trapped and overwhelmed. He denies suicidal and homicidal thoughts.  Duration of problem: Several years Severity of problem: moderate  OBJECTIVE: Mood: Anxious and Affect: Appropriate Risk of harm to self or others: No plan to harm self or others  LIFE CONTEXT: Family and Social: Taylor Bryant has been relying on his dad for support. School/Work: Taylor Bryant is scared to get a job. Self-Care: Taylor Bryant has been staying up late and has been a little bit more impulsive with spoiling his daughter.  Life Changes: Taylor Bryant is trying to decide if he will seek legal action against his ex wife for not following the custody order in place.   GOALS ADDRESSED: Patient will: 1.  Reduce symptoms of: anxiety and stress  2.  Increase knowledge and/or ability of: coping skills and healthy habits  3.  Demonstrate ability to: Increase healthy adjustment to current life circumstances  INTERVENTIONS: Interventions utilized:  Brief CBT was utilized by the clinician focusing on his anxiety and affect on normal cognition. Clinician explained that it  seems like since his wife has been threatening to have his custody taking away that he has become paranoid and overly cautious. Clinician explained that he hasn't done anything to warrant grounds for his custody to be revoked. Clinician asked the patient if he is taking his medications like he is supposed to. Clinician explained that he needs to focus on what he can control versus what he cannot control.  Standardized Assessments completed: Next session.  ASSESSMENT: Patient currently experiencing symptoms of anxiety exacerbated with situational stressors with ex wife and hyper focusing on things from the past.   Patient may benefit from continuing with therapy.  PLAN: 1. Follow up with behavioral health clinician on : 1 week or earlier if needed. 2. Behavioral recommendations: Aims next session for patient concern of tremors. 3. Referral(s): Integrated [facility] (In Clinic) 4. "From scale of 1-10, how likely are you to follow plan?": 6  [name], LCSW

## 2018-01-28 ENCOUNTER — Ambulatory Visit: Payer: Self-pay | Admitting: Licensed Clinical Social Worker

## 2018-01-29 ENCOUNTER — Emergency Department: Payer: Self-pay

## 2018-01-29 ENCOUNTER — Encounter: Payer: Self-pay | Admitting: Radiology

## 2018-01-29 ENCOUNTER — Emergency Department
Admission: EM | Admit: 2018-01-29 | Discharge: 2018-01-29 | Disposition: A | Discharge status: Home / Self Care | Payer: Self-pay | Attending: Student in an Organized Health Care Education/Training Program | Admitting: Student in an Organized Health Care Education/Training Program

## 2018-01-29 ENCOUNTER — Other Ambulatory Visit: Payer: Self-pay

## 2018-01-29 DIAGNOSIS — D680 Von Willebrand's disease: Secondary | ICD-10-CM | POA: Insufficient documentation

## 2018-01-29 DIAGNOSIS — Z87891 Personal history of nicotine dependence: Secondary | ICD-10-CM | POA: Insufficient documentation

## 2018-01-29 DIAGNOSIS — Z79899 Other long term (current) drug therapy: Secondary | ICD-10-CM | POA: Insufficient documentation

## 2018-01-29 DIAGNOSIS — R519 Headache, unspecified: Secondary | ICD-10-CM

## 2018-01-29 DIAGNOSIS — I159 Secondary hypertension, unspecified: Secondary | ICD-10-CM | POA: Insufficient documentation

## 2018-01-29 DIAGNOSIS — R51 Headache: Secondary | ICD-10-CM | POA: Insufficient documentation

## 2018-01-29 DIAGNOSIS — Q231 Congenital insufficiency of aortic valve: Secondary | ICD-10-CM | POA: Insufficient documentation

## 2018-01-29 LAB — CBC WITH DIFFERENTIAL/PLATELET
Abs Immature Granulocytes: 0.03 10*3/uL (ref 0.00–0.07)
Basophils Absolute: 0 10*3/uL (ref 0.0–0.1)
Basophils Relative: 0 %
Eosinophils Absolute: 0.2 10*3/uL (ref 0.0–0.5)
Eosinophils Relative: 2 %
HCT: 42.3 % (ref 39.0–52.0)
Hemoglobin: 13.7 g/dL (ref 13.0–17.0)
Immature Granulocytes: 0 %
LYMPHS ABS: 2.5 10*3/uL (ref 0.7–4.0)
Lymphocytes Relative: 28 %
MCH: 30.7 pg (ref 26.0–34.0)
MCHC: 32.4 g/dL (ref 30.0–36.0)
MCV: 94.8 fL (ref 80.0–100.0)
Monocytes Absolute: 0.7 10*3/uL (ref 0.1–1.0)
Monocytes Relative: 8 %
Neutro Abs: 5.6 10*3/uL (ref 1.7–7.7)
Neutrophils Relative %: 62 %
Platelets: 296 10*3/uL (ref 150–400)
RBC: 4.46 MIL/uL (ref 4.22–5.81)
RDW: 13.2 % (ref 11.5–15.5)
WBC: 9 10*3/uL (ref 4.0–10.5)
nRBC: 0 % (ref 0.0–0.2)

## 2018-01-29 LAB — COMPREHENSIVE METABOLIC PANEL
ALT: 14 U/L (ref 0–44)
ANION GAP: 6 (ref 5–15)
AST: 17 U/L (ref 15–41)
Albumin: 4.1 g/dL (ref 3.5–5.0)
Alkaline Phosphatase: 76 U/L (ref 38–126)
BUN: 12 mg/dL (ref 6–20)
CALCIUM: 8.5 mg/dL — AB (ref 8.9–10.3)
CO2: 25 mmol/L (ref 22–32)
Chloride: 105 mmol/L (ref 98–111)
Creatinine, Ser: 0.73 mg/dL (ref 0.61–1.24)
GFR calc Af Amer: >60 mL/min (ref >60)
GFR calc non Af Amer: >60 mL/min (ref >60)
GLUCOSE: 99 mg/dL (ref 70–99)
Potassium: 3.7 mmol/L (ref 3.5–5.1)
Sodium: 136 mmol/L (ref 135–145)
Total Bilirubin: 0.5 mg/dL (ref 0.3–1.2)
Total Protein: 7.3 g/dL (ref 6.5–8.1)

## 2018-01-29 LAB — URINALYSIS, COMPLETE (UACMP) WITH MICROSCOPIC
Bacteria, UA: NONE SEEN
Bilirubin Urine: NEGATIVE
Glucose, UA: NEGATIVE mg/dL
Hgb urine dipstick: NEGATIVE
Ketones, ur: NEGATIVE mg/dL
Leukocytes, UA: NEGATIVE
Nitrite: NEGATIVE
Protein, ur: NEGATIVE mg/dL
Specific Gravity, Urine: 1.028 (ref 1.005–1.030)
pH: 6 (ref 5.0–8.0)

## 2018-01-29 LAB — URINE DRUG SCREEN, QUALITATIVE (ARMC ONLY)
Amphetamines, Ur Screen: NOT DETECTED
Barbiturates, Ur Screen: NOT DETECTED
Benzodiazepine, Ur Scrn: NOT DETECTED
Cannabinoid 50 Ng, Ur ~~LOC~~: POSITIVE — AB
Cocaine Metabolite,Ur ~~LOC~~: NOT DETECTED
MDMA (Ecstasy)Ur Screen: NOT DETECTED
Methadone Scn, Ur: NOT DETECTED
Opiate, Ur Screen: NOT DETECTED
Phencyclidine (PCP) Ur S: NOT DETECTED
Tricyclic, Ur Screen: NOT DETECTED

## 2018-01-29 LAB — LITHIUM LEVEL: Lithium Lvl: 1.08 mmol/L (ref 0.60–1.20)

## 2018-01-29 MED ORDER — IOHEXOL 350 MG/ML SOLN
75.0000 mL | Freq: Once | INTRAVENOUS | Status: AC | PRN
  Administered 2018-01-29: 75 mL via INTRAVENOUS
  Filled 2018-01-29: qty 75

## 2018-01-29 MED ORDER — DIPHENHYDRAMINE HCL 50 MG/ML IJ SOLN
25.0000 mg | Freq: Once | INTRAMUSCULAR | Status: AC
  Administered 2018-01-29: 25 mg via INTRAVENOUS
  Filled 2018-01-29: qty 1

## 2018-01-29 MED ORDER — PROCHLORPERAZINE EDISYLATE 10 MG/2ML IJ SOLN
10.0000 mg | Freq: Once | INTRAMUSCULAR | Status: AC
  Administered 2018-01-29: 10 mg via INTRAVENOUS
  Filled 2018-01-29: qty 2

## 2018-01-29 MED ORDER — KETOROLAC TROMETHAMINE 30 MG/ML IJ SOLN: 15.0000 mg | Freq: Once | INTRAMUSCULAR | Status: DC

## 2018-01-29 NOTE — Discharge Instructions (Addendum)
Your repeat blood pressure is much improved after treating her headache.  Please follow-up with primary care doctor for repeat blood pressure check and further discussion of whether there is a need long term blood pressure medications.  Return for any additional questions.  As discussed in the emergency department, you may use Tylenol and/or Ibuprofen for headaches. These are "Over the Counter" medications and can be found at most drug stores and grocery stores. Please use the recommended dosing instructions on the bottle/box. Do not exceed the maximum dose for either medications. Please be sure to rest and drink plenty of fluids. Please be sure to call your PCP for a follow-up visit, especially if your headaches persist.  Please call your physician or return to ED if you have: 1. Worsening or change in headaches. 2. Changes in vision. 3. New-onset nausea and vomiting. 4. Numbness, tingling, weakness in your extremities,. 4. Inability to eat or drink adequate amounts of food or liquids. 5. Chest pain, shortness of breath, or difficulty breathing. 6. Neurological changes- dizziness, fainting, loss of function of your arms, legs or other parts of your body. 7. Uncontrolled hypertension. 8. Or any other emergent concerns.

## 2018-01-29 NOTE — ED Provider Notes (Signed)
[facility] Emergency Department Provider Note    First MD Initiated Contact with Patient 01/29/18 2011     (approximate)  I have reviewed the triage vital signs and the nursing notes.   HISTORY  Chief Complaint Headache    HPI Taylor Bryant is a 38 y.o. male the below listed past medical history presents the ER with chief complaint of posterior headache says it was mild a few days ago became more severe today.  Was recently sick with flulike illness and states he has had a headache since then.  Denies any fevers today.  No blurry vision.  No numbness or tingling.  States his been checking his blood pressure has been very elevated which was his primary concern.  Also spoke with his father who is a Therapist, sports in [place] who told him that he has several family members with.  States he is also on lithium and Cymbalta.  Has not made any medication changes.  States he does have a history of chronic tremor but feels like it is been worse over the past several days.  Denies any nausea or or vomiting.  Denies any dysuria.  No noted antihypertensive medications.    Past Medical History:  Diagnosis Date  . Bicuspid aortic valve   . Heart murmur   . Hyperlipidemia   . Schizoaffective disorder (HCC)   . Von Willebrand disease (HCC)    Family History  Problem Relation Age of Onset  . Heart Problems Father    Past Surgical History:  Procedure Laterality Date  . TOOTH EXTRACTION     Patient Active Problem List   Diagnosis Date Noted  . History of aortic valve disorder 12/10/2017  . Bicuspid aortic valve 12/10/2017  . Von Willebrand disease (HCC) 09/26/2017  . Schizophrenia (HCC) 09/17/2014  . Asthma 09/17/2014      Prior to Admission medications   Medication Sig Start Date End Date Taking? Authorizing Provider  atorvastatin (LIPITOR) 40 MG tablet Take 1 tablet (40 mg total) by mouth daily at 6 PM. 11/28/17   McGowan, Carollee Herter A, PA-C  DULoxetine  (CYMBALTA) 30 MG capsule Take 3 capsules (90 mg total) by mouth daily. 12/18/17 01/17/18  Doles-Johnson, Teah, NP  lithium carbonate (ESKALITH) 450 MG CR tablet Take 1 tablet (450 mg total) by mouth 3 (three) times daily. 12/18/17   Doles-Johnson, Teah, NP  NON FORMULARY CBD tablet qd.    [provider]    Allergies Penicillins    Social History Social History   Tobacco Use  . Smoking status: Former Smoker    Packs/day: 0.50    Years: 16.00    Pack years: 8.00    Types: Cigarettes    Last attempt to quit: 2016    Years since quitting: 4.0  . Smokeless tobacco: Never Used  Substance Use Topics  . Alcohol use: Not Currently  . Drug use: No    Review of Systems Patient denies headaches, rhinorrhea, blurry vision, numbness, shortness of breath, chest pain, edema, cough, abdominal pain, nausea, vomiting, diarrhea, dysuria, fevers, rashes or hallucinations unless otherwise stated above in HPI. ____________________________________________   PHYSICAL EXAM:  VITAL SIGNS: Vitals:   01/29/18 2003 01/29/18 2214  BP: (!) 153/75 126/72  Pulse: 87 68  Resp: 20 17  Temp: 98.4 F (36.9 C)   SpO2: 100% 99%    Constitutional: Alert and oriented. Well but anxious appearing.  Able to ambulate with steady gait Eyes: Conjunctivae are normal.  Head: Atraumatic. Nose:  No congestion/rhinnorhea. Mouth/Throat: Mucous membranes are moist.   Neck: No stridor. Painless ROM.  Cardiovascular: Normal rate, regular rhythm. Grossly normal heart sounds.  Good peripheral circulation. Respiratory: Normal respiratory effort.  No retractions. Lungs CTAB. Gastrointestinal: Soft and nontender. No distention. No abdominal bruits. No CVA tenderness. Genitourinary:  Musculoskeletal: No lower extremity tenderness nor edema.  No joint effusions. Neurologic:  CN- intact.  No facial droop, Normal FNF.  Normal heel to shin.  Sensation intact bilaterally. Normal speech and language. No gross focal  neurologic deficits are appreciated. Bilateral UE reting tremor. No gait instability. Skin:  Skin is warm, dry and intact. No rash noted. Psychiatric: Mood and affect are anixous. Speech and behavior are normal.   ____________________________________________   LABS (all labs ordered are listed, but only abnormal results are displayed)  Results for orders placed or performed during the hospital encounter of 01/29/18 (from the past 24 hour(s))  CBC with Differential/Platelet     Status: None   Collection Time: 01/29/18  8:23 PM  Result Value Ref Range   WBC 9.0 4.0 - 10.5 K/uL   RBC 4.46 4.22 - 5.81 MIL/uL   Hemoglobin 13.7 13.0 - 17.0 g/dL   HCT 40.942.3 81.139.0 - 91.452.0 %   MCV 94.8 80.0 - 100.0 fL   MCH 30.7 26.0 - 34.0 pg   MCHC 32.4 30.0 - 36.0 g/dL   RDW 78.213.2 95.611.5 - 21.315.5 %   Platelets 296 150 - 400 K/uL   nRBC 0.0 0.0 - 0.2 %   Neutrophils Relative % 62 %   Neutro Abs 5.6 1.7 - 7.7 K/uL   Lymphocytes Relative 28 %   Lymphs Abs 2.5 0.7 - 4.0 K/uL   Monocytes Relative 8 %   Monocytes Absolute 0.7 0.1 - 1.0 K/uL   Eosinophils Relative 2 %   Eosinophils Absolute 0.2 0.0 - 0.5 K/uL   Basophils Relative 0 %   Basophils Absolute 0.0 0.0 - 0.1 K/uL   Immature Granulocytes 0 %   Abs Immature Granulocytes 0.03 0.00 - 0.07 K/uL  Comprehensive metabolic panel     Status: Abnormal   Collection Time: 01/29/18  8:23 PM  Result Value Ref Range   Sodium 136 135 - 145 mmol/L   Potassium 3.7 3.5 - 5.1 mmol/L   Chloride 105 98 - 111 mmol/L   CO2 25 22 - 32 mmol/L   Glucose, Bld 99 70 - 99 mg/dL   BUN 12 6 - 20 mg/dL   Creatinine, Ser 0.860.73 0.61 - 1.24 mg/dL   Calcium 8.5 (L) 8.9 - 10.3 mg/dL   Total Protein 7.3 6.5 - 8.1 g/dL   Albumin 4.1 3.5 - 5.0 g/dL   AST 17 15 - 41 U/L   ALT 14 0 - 44 U/L   Alkaline Phosphatase 76 38 - 126 U/L   Total Bilirubin 0.5 0.3 - 1.2 mg/dL   GFR calc non Af Amer >60 >60 mL/min   GFR calc Af Amer >60 >60 mL/min   Anion gap 6 5 - 15  Lithium level     Status:  None   Collection Time: 01/29/18  8:23 PM  Result Value Ref Range   Lithium Lvl 1.08 0.60 - 1.20 mmol/L  Urine Drug Screen, Qualitative ([facility] only)     Status: Abnormal   Collection Time: 01/29/18  8:23 PM  Result Value Ref Range   Tricyclic, Ur Screen NONE DETECTED NONE DETECTED   Amphetamines, Ur Screen NONE DETECTED NONE DETECTED   MDMA (Ecstasy)Ur  Screen NONE DETECTED NONE DETECTED   Cocaine Metabolite,Ur [place] NONE DETECTED NONE DETECTED   Opiate, Ur Screen NONE DETECTED NONE DETECTED   Phencyclidine (PCP) Ur S NONE DETECTED NONE DETECTED   Cannabinoid 50 Ng, Ur [place] POSITIVE (A) NONE DETECTED   Barbiturates, Ur Screen NONE DETECTED NONE DETECTED   Benzodiazepine, Ur Scrn NONE DETECTED NONE DETECTED   Methadone Scn, Ur NONE DETECTED NONE DETECTED  Urinalysis, Complete w Microscopic     Status: Abnormal   Collection Time: 01/29/18  8:23 PM  Result Value Ref Range   Color, Urine YELLOW (A) YELLOW   APPearance CLEAR (A) CLEAR   Specific Gravity, Urine 1.028 1.005 - 1.030   pH 6.0 5.0 - 8.0   Glucose, UA NEGATIVE NEGATIVE mg/dL   Hgb urine dipstick NEGATIVE NEGATIVE   Bilirubin Urine NEGATIVE NEGATIVE   Ketones, ur NEGATIVE NEGATIVE mg/dL   Protein, ur NEGATIVE NEGATIVE mg/dL   Nitrite NEGATIVE NEGATIVE   Leukocytes, UA NEGATIVE NEGATIVE   RBC / HPF 0-5 0 - 5 RBC/hpf   WBC, UA 6-10 0 - 5 WBC/hpf   Bacteria, UA NONE SEEN NONE SEEN   Squamous Epithelial / LPF 0-5 0 - 5   Mucus PRESENT    ____________________________________________ ____________________________________________  RADIOLOGY  I personally reviewed all radiographic images ordered to evaluate for the above acute complaints and reviewed radiology reports and findings.  These findings were personally discussed with the patient.  Please see medical record for radiology report.  ____________________________________________   PROCEDURES  Procedure(s) performed:  Procedures    Critical Care performed:  no ____________________________________________   INITIAL IMPRESSION / ASSESSMENT AND PLAN / ED COURSE  Pertinent labs & imaging results that were available during my care of the patient were reviewed by me and considered in my medical decision making (see chart for details).   DDX: htnive urgency, tension, migraine, doubt sah or meningitis or sah  Caylan Schifano is a 38 y.o. who presents to the ED with symptoms as described above.  Patient well-appearing.  He is anxious however but nontoxic.  Given his hypertension family history will evaluate for evidence of subarachnoid seems low likelihood clinically.  Will treat headache.  Will check blood work as he is on lithium to ensure that is not having any toxicity or AKI.  The patient will be placed on continuous pulse oximetry and telemetry for monitoring.  Laboratory evaluation will be sent to evaluate for the above complaints.     Clinical Course as of Jan 30 2215  Wed Jan 29, 2018  2116 Patient with improvement in symptoms after Compazine and Benadryl.  Still having mild discomfort but no meningismus.  CT imaging shows no evidence of acute bleed or mass-effect.  Have low suspicion for subarachnoid at this time as he is otherwise well-appearing with no focal neuro deficits did not have any thunderclap or sudden onset headache.   [PR]  2207 CTA fortunately shows no evidence of acute abnormality or aneurysm. Neuro exam unchanged and nonfocal. Patient more comfortable appearing and less anxious after discussing reassuring findings. Patient stable and appropriate for outpatient follow-up.  Have discussed with the patient and available family all diagnostics and treatments performed thus far and all questions were answered to the best of my ability. The patient demonstrates understanding and agreement with plan.    [PR]    Clinical Course User Index [PR] Willy Eddy, MD     As part of my medical decision making, I reviewed the following data  within the electronic MEDICAL RECORD NUMBER Nursing notes reviewed and incorporated, Labs reviewed, notes from prior ED visits and [place] Controlled Substance Database   ____________________________________________   FINAL CLINICAL IMPRESSION(S) / ED DIAGNOSES  Final diagnoses:  Secondary hypertension  Bad headache      NEW MEDICATIONS STARTED DURING THIS VISIT:  New Prescriptions   No medications on file     Note:  This document was prepared using Dragon voice recognition software and may include unintentional dictation errors.    Willy Eddy, MD 01/29/18 2217

## 2018-01-29 NOTE — ED Triage Notes (Signed)
Pt in with co headache x 2 days states bp has been high at home. States 180/111 at home, co body aches also.

## 2018-02-04 ENCOUNTER — Ambulatory Visit: Payer: Self-pay | Admitting: Licensed Clinical Social Worker

## 2018-02-04 DIAGNOSIS — F410 Panic disorder [episodic paroxysmal anxiety] without agoraphobia: Secondary | ICD-10-CM

## 2018-02-04 DIAGNOSIS — F411 Generalized anxiety disorder: Secondary | ICD-10-CM

## 2018-02-04 DIAGNOSIS — F333 Major depressive disorder, recurrent, severe with psychotic symptoms: Secondary | ICD-10-CM

## 2018-02-04 NOTE — BH Specialist Note (Signed)
Integrated Behavioral Health Follow Up Visit  MRN: [id number] Name: Taylor Bryant   Type of Service: Integrated Behavioral Health- Individual/Family Interpretor:No. Interpretor Name and Language: Not applicable.  SUBJECTIVE: Taylor Bryant is a 38 y.o. male accompanied by himself. Patient was referred by Jacelyn Pieah Doles Johnson NP for mental health. Patient reports the following symptoms/concerns: He reports that he went to the emergency room due to severe headaches and the doctor told him was under a lot of stress. He describes an increase in paranoia and being fearful of of people. He notes that Friday and Saturday that he heard music for two days that wasn't actually playing. He describes seeing shadows the last two weeks. He explains that he has been listening to dark music, watching Taylor Bryant music videos and concerts. He explains that he got pulled over the police last night because he had expired tags and he refused to show his driver's license because it wasn't a [employer] [place] license. He notes that his license is from [place]. He reports that he has to go to court on February 28th in [place]. He denies suicidal and homicidal thoughts.  Duration of problem:Severity of problem: moderate  OBJECTIVE: Mood: Euthymic and Affect: Appropriate Risk of harm to self or others: No plan to harm self or others  LIFE CONTEXT: Family and Social: Taylor Bryant is relying on his father for financial and emotional support. School/Work: Taylor Bryant is fearful of getting a job. Self-Care: Taylor Bryant has been acting on impulse. Life Changes: See above.  GOALS ADDRESSED: Patient will: 1.  Reduce symptoms of: anxiety, insomnia and stress  2.  Increase knowledge and/or ability of: self-management skills  3.  Demonstrate ability to: Increase healthy adjustment to current life circumstances  INTERVENTIONS: Interventions utilized:  Brief CBT was utilized by the clinician focusing on his anxiety and affect on normal  cognition. Clinician explained that it sounds like with the situation with the police that he wasn't helping his situation by not providing his driver's license and probably should have taken care of that five years ago when he moved to [place]. Clinician explained that she thinks his increase in paranoia is due to his increase in anxiety and issues with his ex wife. Clinician completed an aims exam on the patient to test for tardive dyskinesia and will discuss the results with Dr. Mare FerrariLavine, MD, Psychiatric consultant. Clinician explains that he is probably better off not listening to heavy metal music and allowing himself to get proper rest because when he you don't sleep, you will hallucinate.  Standardized Assessments completed: GAD-7 and PHQ 9 GAD 7 16 and PHQ-9 16 ASSESSMENT: Patient currently experiencing symptoms of paranoia.   Patient may benefit from continuing with therapy.  PLAN: 1. Follow up with behavioral health clinician on : 1 week or earlier if needed. 2. Behavioral recommendations: Case consultation with Dr. Mare FerrariLavine regarding tremors. See above.  3. Referral(s): [facility] 4. "From scale of 1-10, how likely are you to follow plan?": 6  Althia FortsHeather B Jarron Curley, LCSW

## 2018-02-05 ENCOUNTER — Other Ambulatory Visit: Payer: Self-pay

## 2018-02-05 DIAGNOSIS — F333 Major depressive disorder, recurrent, severe with psychotic symptoms: Secondary | ICD-10-CM

## 2018-02-05 DIAGNOSIS — F411 Generalized anxiety disorder: Secondary | ICD-10-CM

## 2018-02-05 MED ORDER — LITHIUM CARBONATE ER 450 MG PO TBCR: 450.0000 mg | EXTENDED_RELEASE_TABLET | Freq: Three times a day (TID) | ORAL | 2 refills | Status: DC

## 2018-02-05 MED ORDER — DULOXETINE HCL 30 MG PO CPEP: 90.0000 mg | ORAL_CAPSULE | Freq: Every day | ORAL | 2 refills | Status: DC

## 2018-02-12 ENCOUNTER — Ambulatory Visit: Payer: Self-pay | Admitting: Licensed Clinical Social Worker

## 2018-02-12 DIAGNOSIS — F410 Panic disorder [episodic paroxysmal anxiety] without agoraphobia: Secondary | ICD-10-CM

## 2018-02-12 DIAGNOSIS — F411 Generalized anxiety disorder: Secondary | ICD-10-CM

## 2018-02-12 DIAGNOSIS — F333 Major depressive disorder, recurrent, severe with psychotic symptoms: Secondary | ICD-10-CM

## 2018-02-12 NOTE — BH Specialist Note (Signed)
Integrated Behavioral Health Follow Up Visit  MRN: [id number] Name: Taylor Bryant   Type of Service: Integrated Behavioral Health- Individual/Family Interpretor:No. Interpretor Name and Language: Not applicable.  SUBJECTIVE: Taylor Bryant is a 38 y.o. male accompanied by himself. Patient was referred by [name]  for mental health. Patient reports the following symptoms/concerns: He explains that tremors are not as bad today and he thinks its because of the Cymbalta.He explains that his anxiety is up due to his fear about what will happen with court, his ex wife, custody, and getting a job. He notes that he feels like the Cymbalta is not helping with his anxiety. He notes that he is still listening hearing music playing at night and he is having a hard time sleeping. He explains that his dad told him not to take any naps during the day. He reports that he has been having problems with dry mouth and feels thirty all the time. He notes hat he typically goes to the bathroom three to four times per hour and is experiencing a slight burning sensation when he urinates. He denies suicidal and homicidal thoughts.  Duration of problem: ; Severity of problem: moderate  OBJECTIVE: Mood: Euthymic and Affect: Appropriate Risk of harm to self or others: No plan to harm self or others  LIFE CONTEXT: Family and Social: See above. School/Work: See above. Self-Care: Taylor Bryant is not following a routine. Life Changes: Taylor Bryant stress level has gone up.  GOALS ADDRESSED: Patient will: 1.  Reduce symptoms of: anxiety, insomnia and stress  2.  Increase knowledge and/or ability of: self-management skills  3.  Demonstrate ability to: Increase healthy adjustment to current life circumstances  INTERVENTIONS: Interventions utilized:  Brief CBT was utilized by the clinician focusing on his anxiety and affect on normal cognition. Clinician processed with the patient regarding how he has been doing since the  last follow up session. Clinician explained that it sounds like he is experiencing an increase in paranoia due to all of the stress with court and his ex wife. Clinician explained that he can't control anyone else but can work on controlling himself. Clinician stressed the importance of establishing a routine because he doesn't have enough distractions to keep him from having racing thoughts about things that are out of control at this point in time. Clinician explained to the patient that he has to work on sleep hygiene as well, going to bed around the same time each night and waking up around the same time each day. Clinician explained that she would speak to Dr. Mare Ferrari MD, psychiatric consultant about the issues that he has been experiencing at the next case consultation on 12.12.2020. Standardized Assessments completed: next session.  ASSESSMENT: Patient currently experiencing symptoms of anxiety exacerbated by his fears and situational stressors.   Patient may benefit from continuing with therapy.  PLAN: 1. Follow up with behavioral health clinician on : one week or earlier if needed. 2. Behavioral recommendations: Establishing a routine and being productive. 3. Referral(s): [facility] (In Clinic) 4. "From scale of 1-10, how likely are you to follow plan?": 7  [name], LCSW

## 2018-02-19 ENCOUNTER — Ambulatory Visit: Payer: Self-pay | Admitting: Licensed Clinical Social Worker

## 2018-02-19 DIAGNOSIS — F411 Generalized anxiety disorder: Secondary | ICD-10-CM

## 2018-02-19 DIAGNOSIS — F333 Major depressive disorder, recurrent, severe with psychotic symptoms: Secondary | ICD-10-CM

## 2018-02-19 DIAGNOSIS — F410 Panic disorder [episodic paroxysmal anxiety] without agoraphobia: Secondary | ICD-10-CM

## 2018-02-19 NOTE — BH Specialist Note (Signed)
Integrated Behavioral Health Follow Up Visit  MRN: [id number] Name: Taylor Bryant  Type of Service: [place] Interpretor:No. Interpretor Name and Language: Not applicable.  SUBJECTIVE: Taylor Bryant is a 38 y.o. male accompanied by himself. Patient was referred by Staci Acosta NP for for mental health. Patient reports the following symptoms/concerns: He reports that he is still having a rough time and making a lot of stupid mistakes. He explains that he is still learning some music. He notes that reading and listening to classical music helps to calm her down. He continue to describe paranoia. He reports that he is trying to not call his dad as much because he doesn't want to worry him. He explains that Friday he got angry, had a black out episode, and has no idea where he went. He notes that his friend told him he was manic and called his dad. He reports that he feels ashamed because he used a Xanax again and drank some. He notes that he hears voices telling him to spend money, drink, and do drugs. He denies suicidal and homicidal thoughts. Duration of problem:  Severity of problem: severe  OBJECTIVE: Mood: Anxious and Affect: Appropriate Risk of harm to self or others: No plan to harm self or others  LIFE CONTEXT: Family and Social:  School/Work:  Self-Care:  Life Changes:   GOALS ADDRESSED: Patient will: 1.  Reduce symptoms of: mood instability  2.  Increase knowledge and/or ability of: coping skills, healthy habits, self-management skills and stress reduction  3.  Demonstrate ability to: Increase healthy adjustment to current life circumstances  INTERVENTIONS: Interventions utilized:  Solution-Focused Strategies and Brief CBT was utilized by the clinician focusing on his mood instability and negative coping skills. Clinician processed with the patient regarding how he has been doing since the last follow up session. Clinician discussed with the  patient regarding why he believes that he is making stupid mistakes and the factors contributing to his increasing anxiety and mood symptoms. Clinician explained that in terms of substance abuse that alcohol and Xanax are only going to cause his symptoms to worsen. Clinician explained that he has to set healthy boundaries with other people in his life. Clinician suggested that the patient get money out of the ATM and only have a certain amount of cash on him at all times leaving his card at home. Clinician explained that if he doesn't have his card and only a certain amount of cash then he cannot continuously buy things on impulse. Clinician explained that he has to establish a routine and do things that are productive or he will only continue to spiral. Clinician explained that making changes to his medications will not cause his problems to go away and has to figure out some positive rather than negative coping skills.  Standardized Assessments completed: GAD-7 and PHQ 9 Anxiety is at a 21 and Depression is at an 35.  ASSESSMENT: Patient currently experiencing symptoms of mood instability.   Patient may benefit from continuing with therapy.  PLAN: 1. Follow up with behavioral health clinician on : one week or earlier if needed. 2. Behavioral recommendations: Solution focused strategies, see above. Case consultation with Dr. Octavia Heir, MD, psychiatric consultant explained that Mr. Bedgood's dosage of Cymbalta may need to be increased because it could potentially be contributing to his increasing problems with anxiety. He explained that it's possible that he may need an antipsychotic but aside from that, continue to provide support, manage and muddle through his  symptoms. Depakote is a possibility but he would need to comply with medication management to receive that medication due to the cost.  3. Referral(s): [facility] (In Clinic) 4. "From scale of 1-10, how likely are you to  follow plan?": [place], LCSW

## 2018-02-26 ENCOUNTER — Ambulatory Visit: Payer: Self-pay | Admitting: Licensed Clinical Social Worker

## 2018-02-26 DIAGNOSIS — F411 Generalized anxiety disorder: Secondary | ICD-10-CM

## 2018-02-26 DIAGNOSIS — F410 Panic disorder [episodic paroxysmal anxiety] without agoraphobia: Secondary | ICD-10-CM

## 2018-02-26 DIAGNOSIS — F333 Major depressive disorder, recurrent, severe with psychotic symptoms: Secondary | ICD-10-CM

## 2018-02-26 NOTE — BH Specialist Note (Signed)
Integrated Behavioral Health Follow Up Visit  MRN: [id number] Name: Taylor Bryant   Type of Service: Integrated Behavioral Health- Individual/Family Interpretor:No. Interpretor Name and Language: Not applicable.  SUBJECTIVE: Taylor Bryant is a 38 y.o. male accompanied by himself. Patient was referred by [name] for mental health. Patient reports the following symptoms/concerns: He reports that he has filled out a few job applications. He explains that one of his first goals is to be financially independent with his dad within the next two months. He explains that he feels like his anxiety level is causing him to loose control. He describes increasing his intake of caffeine due to feeling more exhausted than usual. He notes that in terms of paranoia, he is still hearing music and voices but will either play music or pray in spanish to eliminate them. He explains that he wants to learn how to act and live his life without fear of what others are thinking. He denies suicidal and homicidal thoughts.  Duration of problem:  Severity of problem: moderate  OBJECTIVE: Mood: Euthymic and Affect: Appropriate Risk of harm to self or others: No plan to harm self or others  LIFE CONTEXT: Family and Social: Taylor Bryant is trying to spend more time with his daughter. School/Work: See above. Self-Care: See above. Life Changes: See above.   GOALS ADDRESSED: Patient will: 1.  Reduce symptoms of: anxiety  2.  Increase knowledge and/or ability of: coping skills, healthy habits and self-management skills  3.  Demonstrate ability to: Increase healthy adjustment to current life circumstances  INTERVENTIONS: Interventions utilized:  Brief CBT was utilized by the clinician focusing on anxiety and affect on normal cognition. Clinician congratulated the patient on taking the first step towards finding a job. Clinician explained to the patient that if he works on establishing a specific routine and is filling  up his time then he will have less time to sit around to over think and over analyze. Clinician explained that he needs to try to be more proactive, continue making lifestyle changes, and utilize coping skills. Clinician explained that the use of psychotropic medications can help to alleviate symptoms but not completely eliminate them.  Standardized Assessments completed: Next session.  ASSESSMENT: Patient currently experiencing symptoms of anxiety exacerbated by trying to accomplish goals of getting a job within in a certain time frame.   Patient may benefit from continuing with therapy, establishing a routine, and decrease intake of caffeine.   PLAN: 1. Follow up with behavioral health clinician on : 1 week 2. Behavioral recommendations: See above. Case consultation with psychiatric consultant Dr. Mare Ferrari, MD recommended that Taylor Bryant take one of each of his medications in the morning and the rest at night to assist with fatigue. No further recommendations at this point in time.  3. Referral(s): [facility] (In Clinic) 4. "From scale of 1-10, how likely are you to follow plan?": 7  [name], LCSW

## 2018-02-27 ENCOUNTER — Ambulatory Visit: Payer: Self-pay

## 2018-02-27 ENCOUNTER — Other Ambulatory Visit: Payer: Self-pay

## 2018-02-27 ENCOUNTER — Ambulatory Visit: Payer: Self-pay | Admitting: Gerontology

## 2018-02-27 ENCOUNTER — Encounter: Payer: Self-pay | Admitting: Gerontology

## 2018-02-27 VITALS — BP 139/85 | HR 67 | Ht 70.5 in | Wt 188.0 lb

## 2018-02-27 DIAGNOSIS — Z8679 Personal history of other diseases of the circulatory system: Secondary | ICD-10-CM

## 2018-02-27 DIAGNOSIS — D6800 Von Willebrand disease, unspecified: Secondary | ICD-10-CM

## 2018-02-27 DIAGNOSIS — G250 Essential tremor: Secondary | ICD-10-CM

## 2018-02-27 DIAGNOSIS — E785 Hyperlipidemia, unspecified: Secondary | ICD-10-CM
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
## 2018-02-27 NOTE — Patient Instructions (Signed)
Fat and Cholesterol Restricted Eating Plan Getting too much fat and cholesterol in your diet may cause health problems. Choosing the right foods helps keep your fat and cholesterol at normal levels. This can keep you from getting certain diseases. Your doctor may recommend an eating plan that includes:  Total fat: ______% or less of total calories a day.  Saturated fat: ______% or less of total calories a day.  Cholesterol: less than _________mg a day.  Fiber: ______g a day. What are tips for following this plan? Meal planning  At meals, divide your plate into four equal parts: ? Fill one-half of your plate with vegetables and green salads. ? Fill one-fourth of your plate with whole grains. ? Fill one-fourth of your plate with low-fat (lean) protein foods.  Eat fish that is high in omega-3 fats at least two times a week. This includes mackerel, tuna, sardines, and salmon.  Eat foods that are high in fiber, such as whole grains, beans, apples, broccoli, carrots, peas, and barley. General tips   Work with your doctor to lose weight if you need to.  Avoid: ? Foods with added sugar. ? Fried foods. ? Foods with partially hydrogenated oils.  Limit alcohol intake to no more than 1 drink a day for nonpregnant women and 2 drinks a day for men. One drink equals 12 oz of beer, 5 oz of wine, or 1 oz of hard liquor. Reading food labels  Check food labels for: ? Trans fats. ? Partially hydrogenated oils. ? Saturated fat (g) in each serving. ? Cholesterol (mg) in each serving. ? Fiber (g) in each serving.  Choose foods with healthy fats, such as: ? Monounsaturated fats. ? Polyunsaturated fats. ? Omega-3 fats.  Choose grain products that have whole grains. Look for the word "whole" as the first word in the ingredient list. Cooking  Cook foods using low-fat methods. These include baking, boiling, grilling, and broiling.  Eat more home-cooked foods. Eat at restaurants and buffets  less often.  Avoid cooking using saturated fats, such as butter, cream, palm oil, palm kernel oil, and coconut oil. Recommended foods  Fruits  All fresh, canned (in natural juice), or frozen fruits. Vegetables  Fresh or frozen vegetables (raw, steamed, roasted, or grilled). Green salads. Grains  Whole grains, such as whole wheat or whole grain breads, crackers, cereals, and pasta. Unsweetened oatmeal, bulgur, barley, quinoa, or brown rice. Corn or whole wheat flour tortillas. Meats and other protein foods  Ground beef (85% or leaner), grass-fed beef, or beef trimmed of fat. Skinless chicken or turkey. Ground chicken or turkey. Pork trimmed of fat. All fish and seafood. Egg whites. Dried beans, peas, or lentils. Unsalted nuts or seeds. Unsalted canned beans. Nut butters without added sugar or oil. Dairy  Low-fat or nonfat dairy products, such as skim or 1% milk, 2% or reduced-fat cheeses, low-fat and fat-free ricotta or cottage cheese, or plain low-fat and nonfat yogurt. Fats and oils  Tub margarine without trans fats. Light or reduced-fat mayonnaise and salad dressings. Avocado. Olive, canola, sesame, or safflower oils. The items listed above may not be a complete list of foods and beverages you can eat. Contact a dietitian for more information. Foods to avoid Fruits  Canned fruit in heavy syrup. Fruit in cream or butter sauce. Fried fruit. Vegetables  Vegetables cooked in cheese, cream, or butter sauce. Fried vegetables. Grains  White bread. White pasta. White rice. Cornbread. Bagels, pastries, and croissants. Crackers and snack foods that contain trans fat   and hydrogenated oils. Meats and other protein foods  Fatty cuts of meat. Ribs, chicken wings, bacon, sausage, bologna, salami, chitterlings, fatback, hot dogs, bratwurst, and packaged lunch meats. Liver and organ meats. Whole eggs and egg yolks. Chicken and turkey with skin. Fried meat. Dairy  Whole or 2% milk, cream,  half-and-half, and cream cheese. Whole milk cheeses. Whole-fat or sweetened yogurt. Full-fat cheeses. Nondairy creamers and whipped toppings. Processed cheese, cheese spreads, and cheese curds. Beverages  Alcohol. Sugar-sweetened drinks such as sodas, lemonade, and fruit drinks. Fats and oils  Butter, stick margarine, lard, shortening, ghee, or bacon fat. Coconut, palm kernel, and palm oils. Sweets and desserts  Corn syrup, sugars, honey, and molasses. Candy. Jam and jelly. Syrup. Sweetened cereals. Cookies, pies, cakes, donuts, muffins, and ice cream. The items listed above may not be a complete list of foods and beverages you should avoid. Contact a dietitian for more information. Summary  Choosing the right foods helps keep your fat and cholesterol at normal levels. This can keep you from getting certain diseases.  At meals, fill one-half of your plate with vegetables and green salads.  Eat high-fiber foods, like whole grains, beans, apples, carrots, peas, and barley.  Limit added sugar, saturated fats, alcohol, and fried foods. This information is not intended to replace advice given to you by your health care provider. Make sure you discuss any questions you have with your health care provider. Document Released: 06/26/2011 Document Revised: 08/28/2017 Document Reviewed: 09/11/2016 Elsevier Interactive Patient Education  2019 [employer]   

## 2018-03-01 ENCOUNTER — Encounter: Payer: Self-pay | Admitting: Gerontology

## 2018-03-04 ENCOUNTER — Other Ambulatory Visit: Payer: Self-pay

## 2018-03-06 ENCOUNTER — Other Ambulatory Visit: Payer: Self-pay

## 2018-03-06 ENCOUNTER — Ambulatory Visit: Payer: Self-pay | Admitting: Licensed Clinical Social Worker

## 2018-03-06 DIAGNOSIS — F333 Major depressive disorder, recurrent, severe with psychotic symptoms: Secondary | ICD-10-CM

## 2018-03-06 DIAGNOSIS — F411 Generalized anxiety disorder: Secondary | ICD-10-CM

## 2018-03-06 DIAGNOSIS — F410 Panic disorder [episodic paroxysmal anxiety] without agoraphobia: Secondary | ICD-10-CM

## 2018-03-06 DIAGNOSIS — Z09 Encounter for follow-up examination after completed treatment for conditions other than malignant neoplasm: Secondary | ICD-10-CM

## 2018-03-06 NOTE — BH Specialist Note (Signed)
Integrated Behavioral Health Follow Up Visit  MRN: [id number] Name: Taylor Bryant  Type of Service: Integrated Behavioral Health- Individual/Family Interpretor:No. Interpretor Name and Language: Not applicable.  SUBJECTIVE: Taylor Bryant is a 38 y.o. male accompanied by herself. Patient was referred by [name] at [facility] for mental health. Patient reports the following symptoms/concerns: He reports that he is doing a little bit better this week compared to last week. He explains that he has applied for two jobs and has an interview at [employer] next week. He reports that he set a healthy boundary with his ex wife and finally told her, he only wants to speak with her when it involves their daughter. He reports that he has court tomorrow and is trying to not worry about it as much. He explains that he is still experiencing symptoms of paranoia. He denies suicidal and homicidal thoughts. Duration of problem: ; Severity of problem: moderate  OBJECTIVE: Mood: Euthymic and Affect: Appropriate Risk of harm to self or others: No plan to harm self or others  LIFE CONTEXT: Family and Social: see above. School/Work: see above. Self-Care: see above. Life Changes: see above.  GOALS ADDRESSED: Patient will: 1.  Reduce symptoms of: anxiety and mood instability  2.  Increase knowledge and/or ability of: self-management skills and stress reduction  3.  Demonstrate ability to: Increase healthy adjustment to current life circumstances  INTERVENTIONS: Interventions utilized:  Brief CBT was utilized by the clinician focusing on anxiety and affect on normal cognition. Clinician explained that she spoke with the psychiatrist Dr. Mare Ferrari, MD who does not want to place him on anything for anxiety at this point in time. Clinician explained that medication is not the only answer, its a matter of following instructions from his providers, utilizing the coping skills he has learned so far in therapy,  participating in therapy, following medication recommendations, and making lifestyle changes. Clinician explained that he is trying to use band aids to get that instant gratification to fix his problems. Clinician explained that it sounds like he finally stood up to his ex wife and set a healthy boundary with her. Clinician encouraged the patient from utilizing his coping skills and sticking to a routine. Standardized Assessments completed: GAD-7 and PHQ 9 PHQ 9 18 and GAD 7 16 ASSESSMENT: Patient currently experiencing symptoms of anxiety due to situational stressors.  Patient may benefit from see above.  PLAN: 1. Follow up with behavioral health clinician on : one week or earlier if needed. 2. Behavioral recommendations: see above. 3. Referral(s): Integrated [facility] (In Clinic) 4. "From scale of 1-10, how likely are you to follow plan?": 7  [name], LCSW

## 2018-03-07 LAB — CBC WITH DIFFERENTIAL/PLATELET
Basophils Absolute: 0 10*3/uL (ref 0.0–0.2)
Basos: 0 %
EOS (ABSOLUTE): 0.1 10*3/uL (ref 0.0–0.4)
Eos: 1 %
Hematocrit: 41.5 % (ref 37.5–51.0)
Hemoglobin: 14.5 g/dL (ref 13.0–17.7)
Immature Grans (Abs): 0.1 10*3/uL (ref 0.0–0.1)
Immature Granulocytes: 1 %
Lymphocytes Absolute: 2.1 10*3/uL (ref 0.7–3.1)
Lymphs: 20 %
MCH: 31.5 pg (ref 26.6–33.0)
MCHC: 34.9 g/dL (ref 31.5–35.7)
MCV: 90 fL (ref 79–97)
Monocytes Absolute: 0.6 10*3/uL (ref 0.1–0.9)
Monocytes: 5 %
Neutrophils Absolute: 7.5 10*3/uL — ABNORMAL HIGH (ref 1.4–7.0)
Neutrophils: 73 %
Platelets: 300 10*3/uL (ref 150–450)
RBC: 4.6 x10E6/uL (ref 4.14–5.80)
RDW: 12.9 % (ref 11.6–15.4)
WBC: 10.3 10*3/uL (ref 3.4–10.8)

## 2018-03-13 ENCOUNTER — Other Ambulatory Visit: Payer: Self-pay

## 2018-03-13 ENCOUNTER — Ambulatory Visit: Payer: Self-pay | Admitting: Licensed Clinical Social Worker

## 2018-03-13 DIAGNOSIS — F333 Major depressive disorder, recurrent, severe with psychotic symptoms: Secondary | ICD-10-CM

## 2018-03-13 DIAGNOSIS — F411 Generalized anxiety disorder: Secondary | ICD-10-CM

## 2018-03-13 DIAGNOSIS — F410 Panic disorder [episodic paroxysmal anxiety] without agoraphobia: Secondary | ICD-10-CM

## 2018-03-13 NOTE — BH Specialist Note (Signed)
Integrated Behavioral Health Follow Up Visit  MRN: [id number] Name: Ferenc Bartolucci  Type of Service: Integrated Behavioral Health- Individual/Family Interpretor:No. Interpretor Name and Language: Not applicable.   SUBJECTIVE: Renner Seabrook is a 38 y.o. male accompanied by himself. Patient was referred by [name] for mental health. Patient reports the following symptoms/concerns: He explains that he has been feeling sick, gums are swollen, and mouth feels weird. He notes that this has been happening on and off for the last two months. He reports that his daughter has been sick and sees her everyday. He reports that the flu has been going around in the last few weeks at her daughter's school. He describes on Tuesday in the middle of the night, listening to music, and had a couple of crying spells. He explains that he had a panic attack and called the crisis line for help. He explains that he talked to his dad and his dad told him he needs to be taking something for the psychosis. He reports that he is taking his medication as prescribed and his sleep has improved. He explains that his paranoia continues and when he is listens to music that God is speaking to him. He denies suicidal and homicidal thoughts.  Duration of problem:  Severity of problem: moderate  OBJECTIVE: Mood: Anxious and tearful and Affect: Appropriate Risk of harm to self or others: No plan to harm self or others  LIFE CONTEXT: Family and Social: See above.  School/Work: Mr. Snay has had one job interview and has a second interview next week.  Self-Care:  Life Changes: Mr. Vadney has started to attend AA meetings to expand his support system.   GOALS ADDRESSED: Patient will: 1.  Reduce symptoms of: anxiety, depression and mood instability  2.  Increase knowledge and/or ability of: coping skills, healthy habits and self-management skills  3.  Demonstrate ability to: Increase healthy adjustment to current life  circumstances  INTERVENTIONS: Interventions utilized:  Behavioral Activation and Brief CBT was utilized by the clinician focusing on anxiety and affect on normal cognition. Clinician processed with the patient regarding how he has been doing since the last follow up session. Clinician discussed with the patient regarding the factor that are contributing to his symptoms of anxiety. Cliniican explained to the patient that its important that he shower, brush his teeth daily, and put on clean clothes. Clinician explained to the patient that he is over thinking things that are out of his control and is stuck on the negative. Clinciian explained to the patient that positive thinking can improve his overall health and he is more likely to have positive outcomes. Clinician explained to the patient that if he is feeling sad than don't listen to sad music or watch sad movies.  Standardized Assessments completed: GAD-7 and PHQ 9 Depression is at a 17 and anxiety is at a 16. ASSESSMENT: Patient currently experiencing.   Patient may benefit from   PLAN: 1. Follow up with behavioral health clinician on : Follow up in one week or earlier if needed. 2. Behavioral recommendations: Case consultation with Dr. Mare Ferrari, MD, Psychiatric Consultant on Wednesday March 11th.   3. Referral(s): Integrated [facility] (In Clinic) 4. "From scale of 1-10, how likely are you to follow plan?":  [name], LCSW

## 2018-03-20 ENCOUNTER — Ambulatory Visit: Payer: Self-pay | Admitting: Licensed Clinical Social Worker

## 2018-03-20 ENCOUNTER — Telehealth: Payer: Self-pay | Admitting: Licensed Clinical Social Worker

## 2018-03-20 ENCOUNTER — Other Ambulatory Visit: Payer: Self-pay

## 2018-03-20 DIAGNOSIS — F411 Generalized anxiety disorder: Secondary | ICD-10-CM

## 2018-03-20 DIAGNOSIS — F410 Panic disorder [episodic paroxysmal anxiety] without agoraphobia: Secondary | ICD-10-CM

## 2018-03-20 DIAGNOSIS — F333 Major depressive disorder, recurrent, severe with psychotic symptoms: Secondary | ICD-10-CM

## 2018-03-20 NOTE — Telephone Encounter (Signed)
Clinician contacted patient to do a preliminary screening for coronavirus and flu. He reports that he has a cough but denies being exposed to the flu or anyone with the virus.

## 2018-03-20 NOTE — BH Specialist Note (Signed)
Integrated Behavioral Health Follow Up Visit  MRN: [id number] Name: Taylor Bryant   Type of Service: Integrated Behavioral Health- Individual/Family Interpretor:No. Interpretor Name and Language: Not applicable.   SUBJECTIVE: Taylor Bryant is a 38 y.o. male accompanied by herself. Patient was referred by [name] for mental health. Patient reports the following symptoms/concerns: He explains that he is still trying to find a job. He notes that he stopped smoking cigarettes in the last week. He explains that the tremors haven't been as bad and has been able to calm down so he doesn't panic. He explains that he believes that his tremors are caused by his panic attacks and anxiety.  He complained that he hasn't been sleeping well despite doing enough to be exhausted enough to get a good night's rest. He admits to drinking a lot of caffeine through out the day and the evening. He reports that his goal is to be sober for one year, get a job, and rent a house. He notes that he is trying to focus on spending more quality time with his daughter. He reports that he is willing to try Buspar to help with his anxiety and understands that its not a fast acting medication like a benzodiazapine. He denies suicidal and homicidal thoughts.  Duration of problem: ; Severity of problem: moderate  OBJECTIVE: Mood: Euthymic and Affect: Appropriate Risk of harm to self or others: No plan to harm self or others  LIFE CONTEXT: Family and Social: See above. School/Work: See above. Self-Care: He is attending AA meetings.  Life Changes:See above.   GOALS ADDRESSED: Patient will: 1.  Reduce symptoms of: anxiety  2.  Increase knowledge and/or ability of: coping skills, healthy habits and self-management skills  3.  Demonstrate ability to: Increase healthy adjustment to current life circumstances  INTERVENTIONS: Interventions utilized:  Brief CBT was utilized by the clinician focusing on his anxiety and affect  on normal cognition. Clinician processed with the patient regarding how he has been doing since the last follow up session. Clinician explained to the patient that she is glad to hear that he is being more proactive in terms of finding a job and making lifestyle changes. Clinician asked the patient if he is drinking a lot of caffeine and explained that its not his medication that its interfering with the quality of sleep, its the amount of caffeine that he is consuming everyday. Clinician explained to the patient that she had a case consultation with Dr. Mare Ferrari, MD, psychiatric consultant that if he would like he can try Buspar for anxiety. Clinician asked the patient if he is still using CBD, how much, concentration, and how often.  Standardized Assessments completed: next week.  ASSESSMENT: Patient currently experiencing anxiety due to situational stress.  Patient may benefit from continuing to follow up for therapy, coping skills, and continuing to make lifestyle changes.  PLAN: 1. Follow up with behavioral health clinician on : Two weeks or earlier if needed. 2. Behavioral recommendations: See above. 3. Referral(s): Integrated [facility] (In Clinic) 4. "From scale of 1-10, how likely are you to follow plan?": 8  [name], LCSW

## 2018-03-26 ENCOUNTER — Telehealth: Payer: Self-pay | Admitting: Licensed Clinical Social Worker

## 2018-03-26 ENCOUNTER — Other Ambulatory Visit: Payer: Self-pay

## 2018-03-26 DIAGNOSIS — F333 Major depressive disorder, recurrent, severe with psychotic symptoms: Secondary | ICD-10-CM

## 2018-03-26 DIAGNOSIS — F411 Generalized anxiety disorder: Secondary | ICD-10-CM

## 2018-03-26 DIAGNOSIS — F410 Panic disorder [episodic paroxysmal anxiety] without agoraphobia: Secondary | ICD-10-CM

## 2018-03-26 MED ORDER — BUSPIRONE HCL 10 MG PO TABS: 10.0000 mg | ORAL_TABLET | Freq: Three times a day (TID) | ORAL | 1 refills | Status: DC

## 2018-03-26 NOTE — BH Specialist Note (Signed)
Integrated Behavioral Health Medication Management Phone Note  MRN: [id number] NAME: Taylor Bryant  Case consultation with Dr. Mare Ferrari, Md, psychiatric consultant recommended putting Taylor Bryant on Buspar 10 mg three times daily to assist with his increasing anxiety symptoms and continue his medications as prescribed. Patient was contacted and is agreeable to the plans.  Current Medications:  Outpatient Medications Prior to Visit  Medication Sig Dispense Refill  . atorvastatin (LIPITOR) 40 MG tablet Take 1 tablet (40 mg total) by mouth daily at 6 PM. 30 tablet 3  . DULoxetine (CYMBALTA) 30 MG capsule Take 3 capsules (90 mg total) by mouth daily for 30 days. 90 capsule 2  . lithium carbonate (ESKALITH) 450 MG CR tablet Take 1 tablet (450 mg total) by mouth 3 (three) times daily. 90 tablet 2  . NON FORMULARY CBD tablet qd.     No facility-administered medications prior to visit.     Patient has been able to get all medications filled as prescribed: Yes  Patient is currently taking all medications as prescribed: Yes  Patient reports experiencing side effects: No  Patient describes feeling this way on medications: Taylor Bryant describes that his mood is balanced out with the combination of Lithium and Cymbalta.   Additional patient concerns: Taylor Bryant is concerned about his anxiety and feels that he needs to be on something additional for anxiety.  Patient advised to schedule appointment with provider for evaluation of medication side effects or additional concerns: Yes- two weeks.   [name], LCSW

## 2018-04-03 ENCOUNTER — Other Ambulatory Visit: Payer: Self-pay

## 2018-04-03 ENCOUNTER — Ambulatory Visit: Payer: Self-pay | Admitting: Licensed Clinical Social Worker

## 2018-04-03 DIAGNOSIS — F411 Generalized anxiety disorder: Secondary | ICD-10-CM

## 2018-04-03 DIAGNOSIS — F333 Major depressive disorder, recurrent, severe with psychotic symptoms: Secondary | ICD-10-CM

## 2018-04-03 DIAGNOSIS — F410 Panic disorder [episodic paroxysmal anxiety] without agoraphobia: Secondary | ICD-10-CM

## 2018-04-03 NOTE — BH Specialist Note (Signed)
Integrated Behavioral Health Follow Up Phone Visit  MRN: [id number] Name: Taylor Bryant  Type of Service: Integrated Behavioral Health- Individual/Family Interpretor:No. Interpretor Name and Language: Not applicable.  SUBJECTIVE: Taylor Bryant is a 38 y.o. male accompanied by himself. Patient was referred by [name] for mental health. Patient reports the following symptoms/concerns: He reports that he has completed his orientation for his new job at [employer] and is waiting to be trained. He reports that he is glad that he is going to be doing something productive and feel like and adult again. He explains that his anxiety is worse. He describes having head aches and pain in his ears. He explained that something strange happened to him yesterday because his dad called him crying saying that he wanted him to take care of himself and stay safe. He describes having problems with his daughter wanting hugs and kisses but is concerned about following social distancing guidelines. He explains that he tries to be positive but is struggling with some days not wanting to get out of bed and not having motivation to do things. He denies suicidal and homicidal thoughts.  Duration of problem:; Severity of problem: moderate  OBJECTIVE: Mood: Euthymic and Affect: Appropriate Risk of harm to self or others: No plan to harm self or others  LIFE CONTEXT: Family and Social: See above. School/Work: See above. Self-Care: See above.  Life Changes: See above.   GOALS ADDRESSED: Patient will: 1.  Reduce symptoms of: anxiety  2.  Increase knowledge and/or ability of: coping skills  3.  Demonstrate ability to: Increase healthy adjustment to current life circumstances  INTERVENTIONS: Interventions utilized:  Brief CBT was utilized by the clinician focusing on his anxiety and affect on normal cognition. Clinician congratulated the patient on getting a job. Clinician processed with the patient regarding how  things are going in terms of his anxiety. Clinician suggested that in terms of his daughter wanting hugs and kisses to explain things in lamins terms to her about how its not safe right now so we have to listen to what the government and health experts are saying to do. Clinician encouraged the patient to practice self care and take care of himself. Clinician explained that she believes that the increase in his anxiety has to do with starting a new job and the coronavirus.  Standardized Assessments completed: GAD-7 and PHQ 9 Depression and anxiety are both falling at a 16.   ASSESSMENT: Patient currently experiencing symptoms of anxiety over changes in his life.   Patient may benefit from continuing with therapy and utilizing coping skills.  PLAN: 1. Follow up with behavioral health clinician on : one week or earlier if needed. 2. Behavioral recommendations: See above.  3. Referral(s): Integrated [facility] (In Clinic) 4. "From scale of 1-10, how likely are you to follow plan?"8  [name], LCSW

## 2018-04-10 ENCOUNTER — Ambulatory Visit: Payer: Self-pay | Admitting: Licensed Clinical Social Worker

## 2018-04-10 ENCOUNTER — Other Ambulatory Visit: Payer: Self-pay

## 2018-04-10 DIAGNOSIS — F333 Major depressive disorder, recurrent, severe with psychotic symptoms: Secondary | ICD-10-CM

## 2018-04-10 DIAGNOSIS — F411 Generalized anxiety disorder: Secondary | ICD-10-CM

## 2018-04-10 DIAGNOSIS — F410 Panic disorder [episodic paroxysmal anxiety] without agoraphobia: Secondary | ICD-10-CM

## 2018-04-10 NOTE — BH Specialist Note (Signed)
Integrated Behavioral Health Follow Up Phone Visit  MRN: [id number] Name: Taylor Bryant  Type of Service: Integrated Behavioral Health- Individual/Family Interpretor:No. Interpretor Name and Language: Not applicable.  SUBJECTIVE: Taylor Bryant is a 38 y.o. male accompanied by himself. Patient was referred by [name] at [facility] for mental health. Patient reports the following symptoms/concerns: He reports that he has been dealing with an increase in his anxiety since he went to [employer] to shop for groceries yesterday. 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 He explains that he didn't feel so great when he woke up today and has decided to quarantine himself just in case. He explained that he talked to his dad who said he was concerned about him and told him to keep his appointments with his therapist. He notes that he started reading a ton of articles about COVID 19, his diagnosis, and side effects for Buspar. He denies suicidal and homicidal thoughts.  Duration of problem: ; Severity of problem: moderate  OBJECTIVE: Mood: Anxious and Affect: Appropriate Risk of harm to self or others: No plan to harm self or others  LIFE CONTEXT: Family and Social:  School/Work:  Self-Care:  Life Changes:   GOALS ADDRESSED: Patient will: 1.  Reduce symptoms of: anxiety  2.  Increase knowledge and/or ability of: coping skills, healthy habits and self-management skills  3.  Demonstrate ability to: Increase healthy adjustment to current life circumstances  INTERVENTIONS: Interventions utilized:  Brief CBT was utilized by the clinician focusing on anxiety and affect on normal cognition. Clinician processed with the patient regarding how she has been doing since the last follow up session. Clinician explained that most likely he is suffering from allergies but to continue to monitor his symptoms  and call the clinic if they worsen so they can advise him what he needs to do. Clinician explained that his new boss wants him to start training most likely when things are less hectic with COVID 19 and adapting to a new protocols at work. Clinician suggested that the patient stop reading COVID 19 articles and all these other articles because he is creating panic. Clinician explained that its important that he practice self care, only stay informed of updates in the news, and continue to utilize his coping skills.  Standardized Assessments completed: GAD-7 and PHQ 9 Anxiety is at a 18 and depression is at a 14. ASSESSMENT: Patient currently experiencing see above.   Patient may benefit from see above.  PLAN: 1. Follow up with behavioral health clinician on : one week or earlier if needed. 2. Behavioral recommendations: see above. 3. Referral(s): Integrated [facility] (In Clinic) 4. "From scale of 1-10, how likely are you to follow plan?":   [name], LCSW

## 2018-04-17 ENCOUNTER — Encounter: Payer: Self-pay | Admitting: Licensed Clinical Social Worker

## 2018-04-17 ENCOUNTER — Ambulatory Visit: Payer: Self-pay | Admitting: Licensed Clinical Social Worker

## 2018-04-17 ENCOUNTER — Other Ambulatory Visit: Payer: Self-pay

## 2018-04-17 DIAGNOSIS — F411 Generalized anxiety disorder: Secondary | ICD-10-CM

## 2018-04-17 DIAGNOSIS — F333 Major depressive disorder, recurrent, severe with psychotic symptoms: Secondary | ICD-10-CM

## 2018-04-17 DIAGNOSIS — F410 Panic disorder [episodic paroxysmal anxiety] without agoraphobia: Secondary | ICD-10-CM

## 2018-04-17 NOTE — BH Specialist Note (Signed)
Integrated Behavioral Health Follow Up Phone Visit  MRN: [id number] Name: Taylor Bryant   Type of Service: Integrated Behavioral Health- Individual/Family Interpretor:No. Interpretor Name and Language: Not applicable.  SUBJECTIVE: Taylor Bryant is a 38 y.o. male accompanied by himself. Patient was referred by [name] for mental health. Patient reports the following symptoms/concerns: He reports that he has been having a hard time sleeping so he stopped drinking soda and cut out junk food. He explains that he received a call from [facility] that he passed his back ground check and completed orientation. He reports that his first day of work is tomorrow. He explains that he is excited but nervous at the same time with starting a new job. He notes that hasn't worked in about two in half years. He explains that he has been talking with his dad regarding his feelings and he has been supportive. He denies suicidal and homicidal thoughts.  Duration of problem: ; Severity of problem: moderate  OBJECTIVE: Mood: Euthymic and Affect: Appropriate Risk of harm to self or others: No plan to harm self or others  LIFE CONTEXT: Family and Social: See above. School/Work: See above. Self-Care: See above.  Life Changes: See above.  GOALS ADDRESSED: Patient will: 1.  Reduce symptoms of: anxiety  2.  Increase knowledge and/or ability of: healthy habits and stress reduction  3.  Demonstrate ability to: Increase healthy adjustment to current life circumstances  INTERVENTIONS: Interventions utilized:  Brief CBT was utilized by the clinician discussing the patient's anxiety and affect on normal cognition. Clinician congratulated the patient on his new job. Clinician encouraged the patient to continue to make healthier habits in terms of his diet, sleep, and overall lifestyle. Clinician discussed the concept of sleep hygiene; waking up the same time each day, going to bed around the same time each night,  having a routine at bedtime, and no electronics. Clinician explained that the while light from your phone, TV, or any electronic device can confuse the brain into thinking you are supposed to be awake as opposed to asleep. Clinician explained that feeling anxious and excited at the same time about starting a new job is normal.  Standardized Assessments completed: Next session.  ASSESSMENT: Patient currently experiencing symptoms of anxiety due to changes .   Patient may benefit from continuing with therapy and sleep hygiene.Marland Kitchen  PLAN: 1. Follow up with behavioral health clinician on : one week or earlier if needed. 2. Behavioral recommendations: see above. 3. Referral(s): Integrated [facility] (In Clinic) 4. "From scale of 1-10, how likely are you to follow plan?": 10  [name], LCSW

## 2018-04-24 ENCOUNTER — Ambulatory Visit: Payer: Self-pay | Admitting: Licensed Clinical Social Worker

## 2018-04-24 ENCOUNTER — Other Ambulatory Visit: Payer: Self-pay

## 2018-04-24 DIAGNOSIS — F333 Major depressive disorder, recurrent, severe with psychotic symptoms: Secondary | ICD-10-CM

## 2018-04-24 DIAGNOSIS — F410 Panic disorder [episodic paroxysmal anxiety] without agoraphobia: Secondary | ICD-10-CM

## 2018-04-24 NOTE — BH Specialist Note (Signed)
Integrated Behavioral Health Follow Up Phone Visit  MRN: [id number] Name: Taylor Bryant  Type of Service: Integrated Behavioral Health- Individual/Family Interpretor:No. Interpretor Name and Language: Not applicable.  SUBJECTIVE: Taylor Bryant is a 38 y.o. male accompanied by himself. Patient was referred by [name] for mental health. Patient reports the following symptoms/concerns: He reports that he has been doing well. He explains that he has worked the past six days at [employer] and today he has the day off. He explains that he likes having the distraction and doesn't have time to over think things at work. He describes having a few episodes of anxiety and panic attacks. He notes that Monday that he had a crying spell and was upset because it would have been his mom's birthday so he was thinking about her a lot. He denies suicidal and homicidal thoughts.  Duration of problem: ; Severity of problem: moderate  OBJECTIVE: Mood: Euthymic and Affect: unable to assess due to phone visit. Risk of harm to self or others: No plan to harm self or others  LIFE CONTEXT: Family and Social: See above. School/Work: See above. Self-Care: Mr. Widman reports that he is hoping with working and being more physically active will help him to loose some weight.  Life Changes: See above. He describes experiencing some symptoms where he thought he was having a stroke; numbness in face, hands, and stiffness in his arms. He describes experiencing upset stomach and problems urinating.   GOALS ADDRESSED: Patient will: 1.  Reduce symptoms of: anxiety  2.  Increase knowledge and/or ability of: coping skills and self-management skills  3.  Demonstrate ability to: Increase healthy adjustment to current life circumstances  INTERVENTIONS: Interventions utilized:  Brief CBT was utilized by the clinician focusing on the client's anxiety and affect on normal cognition. Clinician processed with the patient regarding  how he has been doing since the last follow up session. Clinician explained that it sounds like he has been adjusting to his new job and keeping busy has helped him. Clinician encouraged the patient to continue to utilize the coping skills he has learned in therapy so far when he is feeling anxious. Clinician explained that some of the symptoms that he has complained of could be side effects of Buspar and will discuss it with Dr. Mare Ferrari, Md, Psychiatric consultant on Tuesday April 21 st @ 9 am.  Standardized Assessments completed: GAD-7 and PHQ 9  ASSESSMENT: Patient currently experiencing symptoms of increasing anxiety due to starting a new job after being out of the work force for the past few years.  Patient may benefit from continuing with therapy and utilizing coping skills he has learned in therapy so far.  PLAN: 1. Follow up with behavioral health clinician on : 1 week or earlier if needed. 2. Behavioral recommendations: Case consultation with Dr. Mare Ferrari, Md, Psychiatric Consultant on Tuesday April 21 st @ 9 am to discuss symptoms and plan.  3. Referral(s): Integrated [facility] (In Clinic) 4. "From scale of 1-10, how likely are you to follow plan?": 10  [name], LCSW

## 2018-05-01 ENCOUNTER — Encounter: Payer: Self-pay | Admitting: Licensed Clinical Social Worker

## 2018-05-01 ENCOUNTER — Other Ambulatory Visit: Payer: Self-pay

## 2018-05-01 ENCOUNTER — Ambulatory Visit: Payer: Self-pay | Admitting: Licensed Clinical Social Worker

## 2018-05-01 DIAGNOSIS — F3162 Bipolar disorder, current episode mixed, moderate: Secondary | ICD-10-CM

## 2018-05-01 DIAGNOSIS — F410 Panic disorder [episodic paroxysmal anxiety] without agoraphobia: Secondary | ICD-10-CM

## 2018-05-01 DIAGNOSIS — F411 Generalized anxiety disorder: Secondary | ICD-10-CM

## 2018-05-01 NOTE — BH Specialist Note (Signed)
Integrated Behavioral Health Follow Up Phone Visit  MRN: [id number] Name: Taylor Bryant   Type of Service: Integrated Behavioral Health- Individual/Family Interpretor:No. Interpretor Name and Language: Not applicable.   SUBJECTIVE: Taylor Bryant is a 38 y.o. male accompanied by himself. Patient was referred by [name] for mental health. Patient reports the following symptoms/concerns: He reports that he had a bad day at work earlier in the week when he forget to take his medicines. He describes experiencing one episode of hypomania where he felt he was talking too much, being impulsive, and immature. He explains that he has been getting good feedback from his managers at work which makes him feel good. He complained of feeling like he is still experiencing side effects from the Buspar. He denies sucidial and homicidal thoughts.  Duration of problem: ; Severity of problem: moderate  OBJECTIVE: Mood: Euthymic and Affect: Appropriate Risk of harm to self or others: No plan to harm self or others  LIFE CONTEXT: Family and Social: Taylor Bryant talks to his dad on the phone daily, attends AA meetings, and has a few friends for support. School/Work: See above. Self-Care: See above. Life Changes: see above.  GOALS ADDRESSED: Patient will: 1.  Reduce symptoms of: anxiety  2.  Increase knowledge and/or ability of: coping skills and stress reduction  3.  Demonstrate ability to: Increase healthy adjustment to current life circumstances  INTERVENTIONS: Interventions utilized:  Brief CBT was utilized by the clinician focusing on the patient's anxiety and affect on normal cognition. Clinician processed with the patient regarding how he has been doing since the last follow up session. Clinician explained to the patient that he made one mistake where he forgot to take his medicine and it caused him to have a bad day. Clinician explained that regardless of whether or not he takes his medications that  sometimes its normal to have a bad day. Clinician explained that she spoke with Dr. Mare Ferrari, MD, psychiatric consultant who said that the side effects he is experiencing are not side effects but rather symptoms of panic attacks. Clinician explained to the patient that Dr. Mare Ferrari, MD is concerned about the problems with urination and eventually wants him to follow up with his primary care doctor to have his prostrate checked and lab work. Clinician encouraged the patient to continue to utilize his coping skills and relaxation techniques when he is feeling anxious.  Standardized Assessments completed: next session.  ASSESSMENT: Patient currently experiencing symptoms of anxiety due to stress.   Patient may benefit from continuing with therapy, taking his medications, and utilizing his coping skills .  PLAN: 1. Follow up with behavioral health clinician on : one week or earlier if needed. 2. Behavioral recommendations: see above. 3. Referral(s): Integrated [facility] (In Clinic) 4. "From scale of 1-10, how likely are you to follow plan?": 9  [name], LCSW

## 2018-05-08 ENCOUNTER — Other Ambulatory Visit: Payer: Self-pay

## 2018-05-08 ENCOUNTER — Ambulatory Visit: Payer: Self-pay | Admitting: Licensed Clinical Social Worker

## 2018-05-08 DIAGNOSIS — F411 Generalized anxiety disorder: Secondary | ICD-10-CM

## 2018-05-08 DIAGNOSIS — F410 Panic disorder [episodic paroxysmal anxiety] without agoraphobia: Secondary | ICD-10-CM

## 2018-05-08 DIAGNOSIS — F333 Major depressive disorder, recurrent, severe with psychotic symptoms: Secondary | ICD-10-CM

## 2018-05-08 MED ORDER — BUSPIRONE HCL 10 MG PO TABS: 10.0000 mg | ORAL_TABLET | Freq: Three times a day (TID) | ORAL | 1 refills | Status: DC

## 2018-05-08 MED ORDER — LITHIUM CARBONATE ER 450 MG PO TBCR: 450.0000 mg | EXTENDED_RELEASE_TABLET | Freq: Three times a day (TID) | ORAL | 2 refills | Status: DC

## 2018-05-08 MED ORDER — DULOXETINE HCL 30 MG PO CPEP: 90.0000 mg | ORAL_CAPSULE | Freq: Every day | ORAL | 2 refills | Status: DC

## 2018-05-08 NOTE — BH Specialist Note (Signed)
Integrated Behavioral Health Follow Up Visit Via Phone  MRN: [id number] Name: Taylor Bryant  Type of Service: Integrated Behavioral Health- Individual/Family Interpretor:No. Interpretor Name and Language: Not applicable.  SUBJECTIVE: Taylor Bryant is a 38 y.o. male accompanied by himself. Patient was referred by [name] for mental health. Patient reports the following symptoms/concerns: He reports that he has had a difficult week because of his anxiety. He describes having an argument with his sister regarding his dad, his current problems, and things from the past. He notes that he also had an argument with his ex wife over constantly asking him for money. He notes that he didn't have the best week at work either. He notes that he feels happy at work and enjoys working. He reports that he has been experiencing multiple panic attacks through out the day. He denies suicidal and homicidal thoughts. Duration of problem: ; Severity of problem: moderate  OBJECTIVE: Mood: Euthymic and Affect: Appropriate Risk of harm to self or others: No plan to harm self or others  LIFE CONTEXT: Family and Social: See above. School/Work: See above. Self-Care: See above. Life Changes: See above.   GOALS ADDRESSED: Patient will: 1.  Reduce symptoms of: anxiety  2.  Increase knowledge and/or ability of: coping skills and stress reduction  3.  Demonstrate ability to: Increase healthy adjustment to current life circumstances  INTERVENTIONS: Interventions utilized:  Brief CBT was utilized by the clinician focusing on the patient's anxiety affect on normal cognition. Clinician processed with the patient regarding how things have been going since the last follow up session. Clinician discussed with the patient the recent triggers for his symptoms of anxiety and panic attacks. Clinician asked the patient if he is utilizing any coping skills or relaxation techniques to be able to calm down in these situations.  Clinician reiterated techniques such as deep breathing, progressive muscle relaxation, guided imagery, and challenging your negative/anxious thoughts.  Standardized Assessments completed: GAD-7 and PHQ 9  ASSESSMENT: Patient currently experiencing symptoms of anxiety and panic attacks due to stress in his life.   Patient may benefit from utilizing coping skills and setting healthy boundaries with friends, family, co-workers, and ex wife .  PLAN: 1. Follow up with behavioral health clinician on : one week or earlier if needed. 2. Behavioral recommendations: Refills were sent in on patient's medications to the pharmacy.  3. Referral(s): Integrated [facility] (In Clinic) 4. "From scale of 1-10, how likely are you to follow plan?": 7  [name], LCSW

## 2018-05-15 ENCOUNTER — Ambulatory Visit: Payer: Self-pay | Admitting: Gerontology

## 2018-05-15 ENCOUNTER — Other Ambulatory Visit: Payer: Self-pay

## 2018-05-15 ENCOUNTER — Ambulatory Visit: Payer: Self-pay | Admitting: Licensed Clinical Social Worker

## 2018-05-15 ENCOUNTER — Encounter: Payer: Self-pay | Admitting: Gerontology

## 2018-05-15 DIAGNOSIS — E785 Hyperlipidemia, unspecified: Secondary | ICD-10-CM

## 2018-05-15 DIAGNOSIS — R399 Unspecified symptoms and signs involving the genitourinary system: Secondary | ICD-10-CM

## 2018-05-15 DIAGNOSIS — F410 Panic disorder [episodic paroxysmal anxiety] without agoraphobia: Secondary | ICD-10-CM

## 2018-05-15 DIAGNOSIS — F3162 Bipolar disorder, current episode mixed, moderate: Secondary | ICD-10-CM

## 2018-05-15 DIAGNOSIS — F411 Generalized anxiety disorder: Secondary | ICD-10-CM

## 2018-05-15 MED ORDER — CIPROFLOXACIN HCL 500 MG PO TABS: 500.0000 mg | ORAL_TABLET | Freq: Two times a day (BID) | ORAL | 0 refills | Status: DC

## 2018-05-15 MED ORDER — ATORVASTATIN CALCIUM 40 MG PO TABS: 40.0000 mg | ORAL_TABLET | Freq: Every day | ORAL | 2 refills | Status: DC
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
## 2018-05-15 NOTE — BH Specialist Note (Signed)
Integrated Behavioral Health Follow Up Phone Visit  MRN: [id number] Name: Taylor Bryant  Type of Service: Integrated Behavioral Health- Individual/Family Interpretor:No. Interpretor Name and Language: Not applicable.  SUBJECTIVE: Taylor Bryant is a 38 y.o. male accompanied by himself. Patient was referred by [name] for mental health. Patient reports the following symptoms/concerns: He reports that he had two panic attacks this weekend at work. He explains that he had a bloody nose and was sent home. He notes that the next day his manager told him he looked ill and say to take the rest of the week off. He complains that he is having problems urinating and experiencing pain when he tries to use the bathroom. He notes that these symptoms are starting to worry him. He describes having issues with his sister and she said some things that really bothered him. He explains that due to being off of work that he hasn't had enough distractions to swift his focus away from his anxiety and depression. He denies suicidal and homicidal thoughts. Duration of problem: ; Severity of problem: moderate  OBJECTIVE: Mood: Euthymic and Affect: Appropriate Risk of harm to self or others: No plan to harm self or others  LIFE CONTEXT: Family and Social: see above. School/Work: see above. Self-Care: see above. Life Changes: see above.  GOALS ADDRESSED: Patient will: 1.  Reduce symptoms of: anxiety  2.  Increase knowledge and/or ability of: coping skills, healthy habits, self-management skills and stress reduction  3.  Demonstrate ability to: Increase healthy adjustment to current life circumstances  INTERVENTIONS: Interventions utilized:  Brief CBT was utilized by the clinician focusing on the patient's anxiety and affect on normal cognition. Clinician processed with the patient regarding how he has been doing since the last follow up session. Clinician utilized reflective listening explaining to the  patient that it sounds like he had a rough couple of days at work, his health, and his family that has increased his symptoms of anxiety. Clinician explained to the patient that there is an available appointment with one of the day time providers this morning that she could fit him in to speak with the nurse practitioner Hurman Horn about his issues. Clinician explained to the patient that its important that he focus on practicing self care, fluids, and getting adequate rest. Clinician encouraged the patient to continue to utilize his coping skills and relaxation techniques discussed at previous follow up sessions. Standardized Assessments completed: Next session.  ASSESSMENT: Patient currently experiencing see above.   Patient may benefit from see above.  PLAN: 1. Follow up with behavioral health clinician on : one week or earlier if needed. 2. Behavioral recommendations: Case consultation with Dr. Mare Ferrari, Md, Psychiatric consultant on Tuesday May 12th @ 9 am.  3. Referral(s): Integrated [facility] (In Clinic) 4. "From scale of 1-10, how likely are you to follow plan?":   [name], LCSW

## 2018-05-16 LAB — UA/M W/RFLX CULTURE, ROUTINE
Bilirubin, UA: NEGATIVE
Glucose, UA: NEGATIVE
Ketones, UA: NEGATIVE
Leukocytes,UA: NEGATIVE
Nitrite, UA: NEGATIVE
Protein,UA: NEGATIVE
RBC, UA: NEGATIVE
Specific Gravity, UA: 1.013 (ref 1.005–1.030)
Urobilinogen, Ur: 0.2 mg/dL (ref 0.2–1.0)
pH, UA: 7 (ref 5.0–7.5)

## 2018-05-16 LAB — MICROSCOPIC EXAMINATION
Bacteria, UA: NONE SEEN
Casts: NONE SEEN /lpf
Epithelial Cells (non renal): NONE SEEN /hpf (ref 0–10)

## 2018-05-22 ENCOUNTER — Ambulatory Visit: Payer: Self-pay | Admitting: Licensed Clinical Social Worker

## 2018-05-22 ENCOUNTER — Other Ambulatory Visit: Payer: Self-pay

## 2018-05-22 DIAGNOSIS — F410 Panic disorder [episodic paroxysmal anxiety] without agoraphobia: Secondary | ICD-10-CM

## 2018-05-22 DIAGNOSIS — F411 Generalized anxiety disorder: Secondary | ICD-10-CM

## 2018-05-22 DIAGNOSIS — F333 Major depressive disorder, recurrent, severe with psychotic symptoms: Secondary | ICD-10-CM

## 2018-05-22 NOTE — BH Specialist Note (Signed)
Integrated Behavioral Health Follow Up Visit via Phone  MRN: [id number] Name: Taylor Bryant  Type of Service: Integrated Behavioral Health- Individual/Family Interpretor:No. Interpretor Name and Language: Not applicable.  SUBJECTIVE: Taylor Bryant is a 38 y.o. male accompanied by himself. Patient was referred by [name] for mental health. Patient reports the following symptoms/concerns: He reports that he has not being doing that well lately. He explains that he has had an increase in his symptoms of anxiety and depression. He describes feeling stressed with issues with his ex wife and his daughter's birthday was the past weekend. He notes that he has had several co workers at work make comments regarding his shaking and asking him if he is okay. He explains that the people around him making comments about the changes in his personality has been bothering him. He explained that he is feeling anxious about having a urology appointment in June because of his problems with his prostate. He describes experiencing nightmares and hallucinations at night time when he is trying to go to sleep. He denies suicidal and homicidal thoughts.  Duration of problem: ; Severity of problem: moderate  OBJECTIVE: Mood: Euthymic and Affect: Appropriate Risk of harm to self or others: No plan to harm self or others  LIFE CONTEXT: Family and Social: see above. School/Work: see above. Self-Care: see above. Life Changes: see above.  GOALS ADDRESSED: Patient will: 1.  Reduce symptoms of: anxiety and stress  2.  Increase knowledge and/or ability of: coping skills  3.  Demonstrate ability to: Increase healthy adjustment to current life circumstances  INTERVENTIONS: Interventions utilized:  Brief CBT was utilized by the clinician focusing on the patient's anxiety and affect on normal cognition. Clinician processed with the patient regarding how he has been doing since the last follow up session. Clinician  assessed the triggers for the patient's increasing symptoms of anxiety. Clinician explained to the patient the increase in shaking is his brain's neurological response to anxiety. Clinician suggested that the patient be honest with his boss and manager at his work that his shaking is due to his anxiety symptoms. Clinician explained to the patient that the problems he is experiencing with a possible UTI indicated in the progress note for his last doctor's appointment could be the cause of the hallucinations. Clinician provided psycho education that when a UTI goes untreated that a person can experiencing hallucinations, confusion, memory loss, and can become disorientated to time and place. Clinician encouraged the patient to continue to utilize his coping skills and set healthy boundaries with everyone in his life.  Standardized Assessments completed: GAD-7 and PHQ 9  ASSESSMENT: Patient currently experiencing see above .   Patient may benefit from see above .  PLAN: 1. Follow up with behavioral health clinician on : one week or earlier if needed. 2. Behavioral recommendations: see above. 3. Referral(s): Integrated [facility] (In Clinic) 4. "From scale of 1-10, how likely are you to follow plan?": 10  [name], LCSW

## 2018-05-29 ENCOUNTER — Ambulatory Visit: Payer: Self-pay | Admitting: Licensed Clinical Social Worker

## 2018-05-29 ENCOUNTER — Other Ambulatory Visit: Payer: Self-pay

## 2018-05-29 ENCOUNTER — Ambulatory Visit: Payer: Self-pay

## 2018-05-29 DIAGNOSIS — F410 Panic disorder [episodic paroxysmal anxiety] without agoraphobia: Secondary | ICD-10-CM

## 2018-05-29 DIAGNOSIS — F411 Generalized anxiety disorder: Secondary | ICD-10-CM

## 2018-05-29 DIAGNOSIS — F333 Major depressive disorder, recurrent, severe with psychotic symptoms: Secondary | ICD-10-CM

## 2018-05-29 NOTE — BH Specialist Note (Signed)
Integrated Behavioral Health Follow Up Visit Via Phone  MRN: [id number] Name: Taylor Bryant  Type of Service: Integrated Behavioral Health- Individual/Family Interpretor:No. Interpretor Name and Language: Not applicable.  SUBJECTIVE: Taylor Bryant is a 38 y.o. male accompanied by himself. Patient was referred by [name] for mental health. Patient reports the following symptoms/concerns: He reports that he has been having a tough time in the last week. He notes that he has had issues at work, with his family, and his ex wife. He explains that he has had issues with low energy, isn't getting enough sleep, and falling asleep at work. He notes that he tried to drink a all natural energy drink but it cause him to feel irritable, moody, and snappy with others at work. He explains that his anxiety and depression have increased. He denies suicidal and homicidal thoughts.  Duration of problem: ; Severity of problem: moderate  OBJECTIVE: Mood: Euthymic and Affect: Appropriate Risk of harm to self or others: No plan to harm self or others  LIFE CONTEXT: Family and Social: see above. School/Work: see above. Self-Care: see above. Life Changes: see above.  GOALS ADDRESSED: Patient will: 1.  Reduce symptoms of: anxiety and stress  2.  Increase knowledge and/or ability of: coping skills, healthy habits and stress reduction  3.  Demonstrate ability to: Increase healthy adjustment to current life circumstances  INTERVENTIONS: Interventions utilized:  Brief CBT was utilized by the clinician focusing on the patient's anxiety and affect on normal cognition. Clinician processed with the patient regarding how she has been doing since the last follow up session. Clinician discussed with the patient the recent stressors that he has been experiencing. Clinician explained to the patient that stress can cause somatic symptoms; lack of energy, lack of sleep, and irritability from not getting enough sleep.  Clinician explained to the patient that he had some set backs this week at work and with his family but the week is about to end. Clinician discussed strategies that the patient can regulate his emotions and encouraged the patient to utilize his coping skills. Clinician suggested that the patient practice self care and allow himself to relax on his days off of work.  Standardized Assessments completed: next week  ASSESSMENT: Patient currently experiencing see above.   Patient may benefit from see above .  PLAN: 1. Follow up with behavioral health clinician on : one week or earlier if needed. 2. Behavioral recommendations: see above. 3. Referral(s): Integrated [facility] (In Clinic) 4. "From scale of 1-10, how likely are you to follow plan?": 10  [name], LCSW

## 2018-06-05 ENCOUNTER — Other Ambulatory Visit: Payer: Self-pay

## 2018-06-05 ENCOUNTER — Ambulatory Visit: Payer: Self-pay | Admitting: Licensed Clinical Social Worker

## 2018-06-05 ENCOUNTER — Ambulatory Visit: Payer: Self-pay | Admitting: Gerontology

## 2018-06-05 ENCOUNTER — Encounter: Payer: Self-pay | Admitting: Gerontology

## 2018-06-05 DIAGNOSIS — F411 Generalized anxiety disorder: Secondary | ICD-10-CM

## 2018-06-05 DIAGNOSIS — N50811 Right testicular pain: Secondary | ICD-10-CM

## 2018-06-05 DIAGNOSIS — F410 Panic disorder [episodic paroxysmal anxiety] without agoraphobia: Secondary | ICD-10-CM

## 2018-06-05 DIAGNOSIS — F333 Major depressive disorder, recurrent, severe with psychotic symptoms: Secondary | ICD-10-CM

## 2018-06-05 DIAGNOSIS — E785 Hyperlipidemia, unspecified: Secondary | ICD-10-CM

## 2018-06-05 MED ORDER — ATORVASTATIN CALCIUM 40 MG PO TABS: 40.0000 mg | ORAL_TABLET | Freq: Every day | ORAL | 2 refills | Status: DC
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
## 2018-06-05 NOTE — BH Specialist Note (Signed)
Integrated Behavioral Health Follow Up Visit Via Phone  MRN: [id number] Name: Taylor Bryant  Type of Service: Integrated Behavioral Health- Individual/Family Interpretor:No. Interpretor Name and Language: Not applicable.  SUBJECTIVE: Taylor Bryant is a 38 y.o. male accompanied by himself. Patient was referred by by Jacelyn Pi, NP for mental health. Patient reports the following symptoms/concerns: He reports that he has had a better week so far this week compared to last week. He notes that he apologize for his outbursts to his co workers, Production designer, theatre/television/film, and boss for what happened last week. He reports that he is trying to set healthy boundaries with his ex wife and sister to avoid further conflicts with either of them. He describes having a couple of episodes of panic attacks this week but drank some water and tried some deep breathing techniques. He denies suicidal and homicidal thoughts.  Duration of problem: ; Severity of problem: moderate  OBJECTIVE: Mood: Euthymic and Affect: Appropriate Risk of harm to self or others: No plan to harm self or others  LIFE CONTEXT: Family and Social: see above. School/Work: see above. Self-Care: see above. Life Changes: see above.  GOALS ADDRESSED: Patient will: 1.  Reduce symptoms of: anxiety  2.  Increase knowledge and/or ability of: coping skills and stress reduction  3.  Demonstrate ability to: Increase healthy adjustment to current life circumstances  INTERVENTIONS: Interventions utilized:  Brief CBT was utilized by the clinician focusing on the patient's anxiety and affect on normal cognition. Clinician processed with the patient regarding how things have been going since the last follow up session. Clinician explained to the patient that it sounds like he was able to put things in perspective from last week to this week in terms of his outburst and mental health in the last week. Clinician encouraged the patient to continue to utilize his coping  skills, practice self care, and set healthy boundaries with his loved ones.  Standardized Assessments completed: GAD-7 and PHQ 9  ASSESSMENT: Patient currently experiencing see above .   Patient may benefit from see above.  PLAN: 1. Follow up with behavioral health clinician on : one week or earlier if needed. 2. Behavioral recommendations: see above. 3. Referral(s): Integrated [facility] (In Clinic) 4. "From scale of 1-10, how likely are you to follow plan?":   [name], LCSW

## 2018-06-12 ENCOUNTER — Ambulatory Visit: Payer: Self-pay | Admitting: Licensed Clinical Social Worker

## 2018-06-12 ENCOUNTER — Other Ambulatory Visit: Payer: Self-pay

## 2018-06-12 DIAGNOSIS — F411 Generalized anxiety disorder: Secondary | ICD-10-CM

## 2018-06-12 DIAGNOSIS — F410 Panic disorder [episodic paroxysmal anxiety] without agoraphobia: Secondary | ICD-10-CM

## 2018-06-12 DIAGNOSIS — F333 Major depressive disorder, recurrent, severe with psychotic symptoms: Secondary | ICD-10-CM

## 2018-06-12 NOTE — BH Specialist Note (Signed)
Integrated Behavioral Health Follow Up Visit Via Phone  MRN: [id number] Name: Taylor Bryant  Type of Service: Integrated Behavioral Health- Individual/Family Interpretor:No. Interpretor Name and Language: Not applicable.  SUBJECTIVE: Taylor Bryant is a 38 y.o. male accompanied by himself. Patient was referred by [name] for mental health. Patient reports the following symptoms/concerns: He reports that he has had a very difficult week so far. He explains that he has been experiencing an increase in his panic attacks due to problems with a friend, stress at work, and problems with his family. He explains that he got so upset yesterday that he decided to drink two beers and took a Xanax. He notes that he is disappointed and ashamed of himself for what he did. He notes that he is stressed about court and his appointment with the urologist is next week as well. He denies suicidal and homicidal thoughts.  Duration of problem: ; Severity of problem: moderate  OBJECTIVE: Mood: Euthymic and Affect: Appropriate Risk of harm to self or others: No plan to harm self or others  LIFE CONTEXT: Family and Social: see above. School/Work: see above. Self-Care: see above. Life Changes: see above.  GOALS ADDRESSED: Patient will: 1.  Reduce symptoms of: anxiety  2.  Increase knowledge and/or ability of: coping skills, healthy habits, self-management skills and stress reduction  3.  Demonstrate ability to: Increase healthy adjustment to current life circumstances  INTERVENTIONS: Interventions utilized:  Brief CBT was utilized by the clinician focusing on the patient's anxiety and affect on normal cognition. Clinician processed with the patient regarding how he has been doing since the last follow up session. Clinician discussed with the patient the events that took place to contribute to his increase in panic attacks. Clinician explained to the patient that drinking alcohol and taking Xanax are only  temporary relief from stress and anxiety but will ultimately not fix his problems. Clinician explained to the patient that taking drugs and drinking alcohol are only going to cause his problems to worsen. Clinician explained to the patient that he had a set back and made some mistakes but he can't sit around wallowing in his self pity. Clinician explained to the patient that he needs to continue to take his medications, practice self care, utilize his coping skills and set healthy boundaries with people in his life.  Standardized Assessments completed: Next week.  ASSESSMENT: Patient currently experiencing see above.   Patient may benefit from see above.  PLAN: 1. Follow up with behavioral health clinician on : one week or earlier if needed. 2. Behavioral recommendations: see above. 3. Referral(s): Integrated [facility] (In Clinic) 4. "From scale of 1-10, how likely are you to follow plan?": 10  [name], LCSW

## 2018-06-18 ENCOUNTER — Encounter: Payer: Self-pay | Admitting: Urology

## 2018-06-18 ENCOUNTER — Other Ambulatory Visit: Payer: Self-pay

## 2018-06-18 ENCOUNTER — Ambulatory Visit (INDEPENDENT_AMBULATORY_CARE_PROVIDER_SITE_OTHER): Payer: Self-pay | Admitting: Urology

## 2018-06-18 VITALS — BP 161/90 | HR 92 | Wt 194.0 lb

## 2018-06-18 DIAGNOSIS — R3911 Hesitancy of micturition: Secondary | ICD-10-CM

## 2018-06-18 DIAGNOSIS — N5201 Erectile dysfunction due to arterial insufficiency: Secondary | ICD-10-CM

## 2018-06-18 DIAGNOSIS — R350 Frequency of micturition: Secondary | ICD-10-CM

## 2018-06-18 DIAGNOSIS — N390 Urinary tract infection, site not specified: Secondary | ICD-10-CM

## 2018-06-18 LAB — URINALYSIS, COMPLETE
Bilirubin, UA: NEGATIVE
Glucose, UA: NEGATIVE
Ketones, UA: NEGATIVE
Leukocytes,UA: NEGATIVE
Nitrite, UA: NEGATIVE
Protein,UA: NEGATIVE
Specific Gravity, UA: 1.02 (ref 1.005–1.030)
Urobilinogen, Ur: 0.2 mg/dL (ref 0.2–1.0)
pH, UA: 7 (ref 5.0–7.5)

## 2018-06-18 LAB — MICROSCOPIC EXAMINATION
Bacteria, UA: NONE SEEN
RBC, Urine: NONE SEEN /hpf (ref 0–2)

## 2018-06-18 LAB — BLADDER SCAN AMB NON-IMAGING: Scan Result: 13

## 2018-06-18 MED ORDER — TADALAFIL 5 MG PO TABS: 5.0000 mg | ORAL_TABLET | Freq: Every day | ORAL | 5 refills | Status: DC | PRN

## 2018-06-18 NOTE — Patient Instructions (Signed)
Erectile Dysfunction Erectile dysfunction (ED) is the inability to get or keep an erection in order to have sexual intercourse. Erectile dysfunction may include:  Inability to get an erection.  Lack of enough hardness of the erection to allow penetration.  Loss of the erection before sex is finished. What are the causes? This condition may be caused by:  Certain medicines, such as: ? Pain relievers. ? Antihistamines. ? Antidepressants. ? Blood pressure medicines. ? Water pills (diuretics). ? Ulcer medicines. ? Muscle relaxants. ? Drugs.  Excessive drinking.  Psychological causes, such as: ? Anxiety. ? Depression. ? Sadness. ? Exhaustion. ? Performance fear. ? Stress.  Physical causes, such as: ? Artery problems. This may include diabetes, smoking, liver disease, or atherosclerosis. ? High blood pressure. ? Hormonal problems, such as low testosterone. ? Obesity. ? Nerve problems. This may include back or pelvic injuries, diabetes mellitus, multiple sclerosis, or Parkinson disease. What are the signs or symptoms? Symptoms of this condition include:  Inability to get an erection.  Lack of enough hardness of the erection to allow penetration.  Loss of the erection before sex is finished.  Normal erections at some times, but with frequent unsatisfactory episodes.  Low sexual satisfaction in either partner due to erection problems.  A curved penis occurring with erection. The curve may cause pain or the penis may be too curved to allow for intercourse.  Never having nighttime erections. How is this diagnosed? This condition is often diagnosed by:  Performing a physical exam to find other diseases or specific problems with the penis.  Asking you detailed questions about the problem.  Performing blood tests to check for diabetes mellitus or to measure hormone levels.  Performing other tests to check for underlying health conditions.  Performing an ultrasound  exam to check for scarring.  Performing a test to check blood flow to the penis.  Doing a sleep study at home to measure nighttime erections. How is this treated? This condition may be treated by:  Medicine taken by mouth to help you achieve an erection (oral medicine).  Hormone replacement therapy to replace low testosterone levels.  Medicine that is injected into the penis. Your health care provider may instruct you how to give yourself these injections at home.  Vacuum pump. This is a pump with a ring on it. The pump and ring are placed on the penis and used to create pressure that helps the penis become erect.  Penile implant surgery. In this procedure, you may receive: ? An inflatable implant. This consists of cylinders, a pump, and a reservoir. The cylinders can be inflated with a fluid that helps to create an erection, and they can be deflated after intercourse. ? A semi-rigid implant. This consists of two silicone rubber rods. The rods provide some rigidity. They are also flexible, so the penis can both curve downward in its normal position and become straight for sexual intercourse.  Blood vessel surgery, to improve blood flow to the penis. During this procedure, a blood vessel from a different part of the body is placed into the penis to allow blood to flow around (bypass) damaged or blocked blood vessels.  Lifestyle changes, such as exercising more, losing weight, and quitting smoking. Follow these instructions at home: Medicines   Take over-the-counter and prescription medicines only as told by your health care provider. Do not increase the dosage without first discussing it with your health care provider.  If you are using self-injections, perform injections as directed by your   health care provider. Make sure to avoid any veins that are on the surface of the penis. After giving an injection, apply pressure to the injection site for 5 minutes. General  instructions  Exercise regularly, as directed by your health care provider. Work with your health care provider to lose weight, if needed.  Do not use any products that contain nicotine or tobacco, such as cigarettes and e-cigarettes. If you need help quitting, ask your health care provider.  Before using a vacuum pump, read the instructions that come with the pump and discuss any questions with your health care provider.  Keep all follow-up visits as told by your health care provider. This is important. Contact a health care provider if:  You feel nauseous.  You vomit. Get help right away if:  You are taking oral or injectable medicines and you have an erection that lasts longer than 4 hours. If your health care provider is unavailable, go to the nearest emergency room for evaluation. An erection that lasts much longer than 4 hours can result in permanent damage to your penis.  You have severe pain in your groin or abdomen.  You develop redness or severe swelling of your penis.  You have redness spreading up into your groin or lower abdomen.  You are unable to urinate.  You experience chest pain or a rapid heart beat (palpitations) after taking oral medicines. Summary  Erectile dysfunction (ED) is the inability to get or keep an erection during sexual intercourse. This problem can usually be treated successfully.  This condition is diagnosed based on a physical exam, your symptoms, and tests to determine the cause. Treatment varies depending on the cause, and may include medicines, hormone therapy, surgery, or vacuum pump.  You may need follow-up visits to make sure that you are using your medicines or devices correctly.  Get help right away if you are taking or injecting medicines and you have an erection that lasts longer than 4 hours. This information is not intended to replace advice given to you by your health care provider. Make sure you discuss any questions you have with  your health care provider. Document Released: 12/23/1999 Document Revised: 01/11/2016 Document Reviewed: 01/11/2016 Elsevier Interactive Patient Education  2019 Elsevier Inc. Overactive Bladder, Adult  Overactive bladder refers to a condition in which a person has a sudden need to pass urine. The person may leak urine if he or she cannot get to the bathroom fast enough (urinary incontinence). A person with this condition may also wake up several times in the night to go to the bathroom. Overactive bladder is associated with poor nerve signals between your bladder and your brain. Your bladder may get the signal to empty before it is full. You may also have very sensitive muscles that make your bladder squeeze too soon. These symptoms might interfere with daily work or social activities. What are the causes? This condition may be associated with or caused by:  Urinary tract infection.  Infection of nearby tissues, such as the prostate.  Prostate enlargement.  Surgery on the uterus or urethra.  Bladder stones, inflammation, or tumors.  Drinking too much caffeine or alcohol.  Certain medicines, especially medicines that get rid of extra fluid in the body (diuretics).  Muscle or nerve weakness, especially from: ? A spinal cord injury. ? Stroke. ? Multiple sclerosis. ? Parkinson's disease.  Diabetes.  Constipation. What increases the risk? You may be at greater risk for overactive bladder if you:  Are an   older adult.  Smoke.  Are going through menopause.  Have prostate problems.  Have a neurological disease, such as stroke, dementia, Parkinson's disease, or multiple sclerosis (MS).  Eat or drink things that irritate the bladder. These include alcohol, spicy food, and caffeine.  Are overweight or obese. What are the signs or symptoms? Symptoms of this condition include:  Sudden, strong urge to urinate.  Leaking urine.  Urinating 8 or more times a day.  Waking up to  urinate 2 or more times a night. How is this diagnosed? Your health care provider may suspect overactive bladder based on your symptoms. He or she will diagnose this condition by:  A physical exam and medical history.  Blood or urine tests. You might need bladder or urine tests to help determine what is causing your overactive bladder. You might also need to see a health care provider who specializes in urinary tract problems (urologist). How is this treated? Treatment for overactive bladder depends on the cause of your condition and whether it is mild or severe. You can also make lifestyle changes at home. Options include:  Bladder training. This may include: ? Learning to control the urge to urinate by following a schedule that directs you to urinate at regular intervals (timed voiding). ? Doing Kegel exercises to strengthen your pelvic floor muscles, which support your bladder. Toning these muscles can help you control urination, even if your bladder muscles are overactive.  Special devices. This may include: ? Biofeedback, which uses sensors to help you become aware of your body's signals. ? Electrical stimulation, which uses electrodes placed inside the body (implanted) or outside the body. These electrodes send gentle pulses of electricity to strengthen the nerves or muscles that control the bladder. ? Women may use a plastic device that fits into the vagina and supports the bladder (pessary).  Medicines. ? Antibiotics to treat bladder infection. ? Antispasmodics to stop the bladder from releasing urine at the wrong time. ? Tricyclic antidepressants to relax bladder muscles. ? Injections of botulinum toxin type A directly into the bladder tissue to relax bladder muscles.  Lifestyle changes. This may include: ? Weight loss. Talk to your health care provider about weight loss methods that would work best for you. ? Diet changes. This may include reducing how much alcohol and caffeine  you consume, or drinking fluids at different times of the day. ? Not smoking. Do not use any products that contain nicotine or tobacco, such as cigarettes and e-cigarettes. If you need help quitting, ask your health care provider.  Surgery. ? A device may be implanted to help manage the nerve signals that control urination. ? An electrode may be implanted to stimulate electrical signals in the bladder. ? A procedure may be done to change the shape of the bladder. This is done only in very severe cases. Follow these instructions at home: Lifestyle  Make any diet or lifestyle changes that are recommended by your health care provider. These may include: ? Drinking less fluid or drinking fluids at different times of the day. ? Cutting down on caffeine or alcohol. ? Doing Kegel exercises. ? Losing weight if needed. ? Eating a healthy and balanced diet to prevent constipation. This may include:  Eating foods that are high in fiber, such as fresh fruits and vegetables, whole grains, and beans.  Limiting foods that are high in fat and processed sugars, such as fried and sweet foods. General instructions  Take over-the-counter and prescription medicines only as told   by your health care provider.  If you were prescribed an antibiotic medicine, take it as told by your health care provider. Do not stop taking the antibiotic even if you start to feel better.  Use any implants or pessary as told by your health care provider.  If needed, wear pads to absorb urine leakage.  Keep a journal or log to track how much and when you drink and when you feel the need to urinate. This will help your health care provider monitor your condition.  Keep all follow-up visits as told by your health care provider. This is important. Contact a health care provider if:  You have a fever.  Your symptoms do not get better with treatment.  Your pain and discomfort get worse.  You have more frequent urges to  urinate. Get help right away if:  You are not able to control your bladder. Summary  Overactive bladder refers to a condition in which a person has a sudden need to pass urine.  Several conditions may lead to an overactive bladder.  Treatment for overactive bladder depends on the cause and severity of your condition.  Follow your health care provider's instructions about lifestyle changes, doing Kegel exercises, keeping a journal, and taking medicines. This information is not intended to replace advice given to you by your health care provider. Make sure you discuss any questions you have with your health care provider. Document Released: 10/21/2008 Document Revised: 01/10/2017 Document Reviewed: 01/10/2017 Elsevier Interactive Patient Education  2019 Elsevier Inc.  

## 2018-06-18 NOTE — Progress Notes (Signed)
06/18/2018 2:59 PM   Barnes & NobleLuis Botsford 04/28/1980 [id number]  Referring provider: Rolm GalaIloabachie, Chioma E, NP [address] [place], [place] [address]  Chief Complaint  Patient presents with  . Recurrent UTI    HPI:  38 yo male referred for LUTS. He recalls getting shots for "bipolar" disorder at about 2015. He recalls a period of decreased libido but then started a relationship. He then noted blood in the ajaculate Sept 2019. All tests were "fine" . Now after ejaculation he get some discomfort. It feels "weird". He is taking cymbalta. This also caused difficulty urinating. He noticed frequency and hesitancy. His libido is low and he has trouble getting an erection. He took a "medicine" from the "gas station" and had a bad HA. He felt something wired years ago in the right testicle. All his tests - "HIV, gonnorhea" were normal. [facility]. He is drinking plenty of water but a lot soda. He has a 22five year old child.   NG risk includes mental health issues.  No constipation.   Modifying factors: There are no other modifying factors  Associated signs and symptoms: There are no other associated signs and symptoms Aggravating and relieving factors: There are no other aggravating or relieving factors Severity: Moderate Duration: Persistent  PMH: Past Medical History:  Diagnosis Date  . Bicuspid aortic valve   . Heart murmur   . Hyperlipidemia   . Schizoaffective disorder (HCC)   . Von Willebrand disease [facility](HCC)     Surgical History: Past Surgical History:  Procedure Laterality Date  . TOOTH EXTRACTION      Home Medications:  Allergies as of 06/18/2018      Reactions   Penicillins Rash      Medication List       Accurate as of June 18, 2018  2:59 PM. If you have any questions, ask your nurse or doctor.        atorvastatin 40 MG tablet Commonly known as:  LIPITOR Take 1 tablet (40 mg total) by mouth daily at 6 PM.   busPIRone 10 MG tablet Commonly known  as:  BUSPAR Take 1 tablet (10 mg total) by mouth 3 (three) times daily for 30 days.   DULoxetine 30 MG capsule Commonly known as:  CYMBALTA Take 3 capsules (90 mg total) by mouth daily for 30 days.   lithium carbonate 450 MG CR tablet Commonly known as:  ESKALITH Take 1 tablet (450 mg total) by mouth 3 (three) times daily.   methylcellulose oral powder Take by mouth daily.   NON FORMULARY CBD tablet qd.       Allergies:  Allergies  Allergen Reactions  . Penicillins Rash    Family History: Family History  Problem Relation Age of Onset  . Heart Problems Father     Social History:  reports that he quit smoking about 4 years ago. His smoking use included cigarettes. He has a 4.00 pack-year smoking history. He has never used smokeless tobacco. He reports previous alcohol use. He reports that he does not use drugs.  ROS:                                        Physical Exam: There were no vitals taken for this visit.  Constitutional:  Alert and oriented, No acute distress. HEENT: [place] AT, moist mucus membranes.  Trachea midline, no masses.  Cardiovascular: No clubbing, cyanosis, or edema. Respiratory: Normal respiratory effort, no increased work of breathing. GI: Abdomen is soft, nontender, nondistended, no abdominal masses Back: No CVA tenderness GU: GU: Penis uncircumcised, normal foreskin, testicles descended bilaterally and palpably normal, a 5 mm spermatocele right epididymal heal, left epididymis palpably normal, scrotum normal DRE: Prostate 20 g, smooth without hard area or nodule Lymph: No cervical or inguinal lymphadenopathy. Skin: No rashes, bruises or suspicious lesions. Neurologic: Grossly intact, no focal deficits, moving all 4 extremities. Psychiatric: Normal mood and affect.  Laboratory Data: Lab Results  Component Value Date   WBC 10.3 03/06/2018   HGB 14.5 03/06/2018   HCT 41.5 03/06/2018   MCV 90 03/06/2018   PLT 300  03/06/2018    Lab Results  Component Value Date   CREATININE 0.73 01/29/2018    No results found for: PSA  No results found for: TESTOSTERONE  Lab Results  Component Value Date   HGBA1C 5.6 09/10/2017    Urinalysis    Component Value Date/Time   COLORURINE YELLOW (A) 01/29/2018 2023   APPEARANCEUR Clear 05/15/2018 1132   LABSPEC 1.028 01/29/2018 2023   PHURINE 6.0 01/29/2018 2023   GLUCOSEU Negative 05/15/2018 1132   HGBUR NEGATIVE 01/29/2018 2023   BILIRUBINUR Negative 05/15/2018 1132   [place] 01/29/2018 2023   PROTEINUR Negative 05/15/2018 1132   PROTEINUR NEGATIVE 01/29/2018 2023   NITRITE Negative 05/15/2018 1132   NITRITE NEGATIVE 01/29/2018 2023   LEUKOCYTESUR Negative 05/15/2018 1132    Lab Results  Component Value Date   LABMICR Comment 05/15/2018   WBCUA 0-5 05/15/2018   LABEPIT None seen 05/15/2018   BACTERIA None seen 05/15/2018    Pertinent Imaging:none  No results found for this or any previous visit. No results found for this or any previous visit. No results found for this or any previous visit. No results found for this or any previous visit. No results found for this or any previous visit. No results found for this or any previous visit. No results found for this or any previous visit. No results found for this or any previous visit.  Assessment & Plan:    1. Frequency - he could consider a pde5i. Exam, UA and PVR normal. - Urinalysis, Complete - Bladder Scan (Post Void Residual) in office  2. ED - as above. Discussed the nature r/b of pde5i. We may check a testosterone one morning.    No follow-ups on file.  Festus Aloe, MD  [facility] [address], [place], [place] [address] [phone]

## 2018-06-19 ENCOUNTER — Other Ambulatory Visit: Payer: Self-pay

## 2018-06-19 ENCOUNTER — Ambulatory Visit: Payer: Self-pay | Admitting: Licensed Clinical Social Worker

## 2018-06-19 DIAGNOSIS — F410 Panic disorder [episodic paroxysmal anxiety] without agoraphobia: Secondary | ICD-10-CM

## 2018-06-19 DIAGNOSIS — F333 Major depressive disorder, recurrent, severe with psychotic symptoms: Secondary | ICD-10-CM

## 2018-06-19 DIAGNOSIS — F411 Generalized anxiety disorder: Secondary | ICD-10-CM

## 2018-06-19 MED ORDER — BUSPIRONE HCL 10 MG PO TABS: 10.0000 mg | ORAL_TABLET | Freq: Three times a day (TID) | ORAL | 2 refills | Status: DC

## 2018-06-19 NOTE — BH Specialist Note (Signed)
Integrated Behavioral Health Follow Up Visit Via Phone  MRN: [id number] Name: Taylor Bryant  Number of [place] Clinician visits: N.A. Session Start time: 9 am  Session End time: 9:50 am Total time: 50 minutes  Type of Service: [place] Interpretor:No. Interpretor Name and Language: Not applicable.  SUBJECTIVE: Taylor Bryant is a 38 y.o. male accompanied by himself. Patient was referred by Staci Acosta NP for mental health. Patient reports the following symptoms/concerns: He describes experiencing a lot of stress having to go to court, saw a urologist, and feeling like his body is turning against him. He reports that the case in court was dismissed and he is relieved that things worked out. He explains that he has been feeling down and depressed lately. He notes that his dad has been supportive and is encouraging him to make some lifestyle changes. He explains that he is struggling with having to go into work on days where he is feeling down and depressed and his anxiety is high. He notes that he has been experiencing some episodes of paranoia that his co workers are talking about him behind his back. He denies suicidal and homicidal thoughts.  Duration of problem: ; Severity of problem: moderate  OBJECTIVE: Mood: Euthymic and Affect: Appropriate Risk of harm to self or others: No plan to harm self or others  LIFE CONTEXT: Family and Social: see above. School/Work: see above. Self-Care: see above. Life Changes: see above.  GOALS ADDRESSED: Patient will: 1.  Reduce symptoms of: anxiety  2.  Increase knowledge and/or ability of: coping skills, healthy habits, self-management skills and stress reduction  3.  Demonstrate ability to: Increase healthy adjustment to current life circumstances  INTERVENTIONS: Interventions utilized:  Brief CBT was utilized by the clinician focusing on the patient's mood and behavioral activation.  Clinician processed with the patient regarding how he has been doing since the last follow up session. Clinician explained to the patient that in times of stress and doubt that its important to focus on the positive things happening in his life right now. Clinician explained to the patient that having his court case dismissed is a good thing. Clinician suggested that the patient work on being proactive with dealing with his problems instead of trying to avoid or escape like he did in the past. Clinician encouraged the patient to practice self care. Clinician explained that in addition to self care that the patient needs to have a hobby and healthy outlet outside of work.  Standardized Assessments completed: GAD-7 and PHQ 9  ASSESSMENT: Patient currently experiencing see above .   Patient may benefit from see above.  PLAN 1. Follow up with behavioral health clinician on : one week or earlier if needed. 2. Behavioral recommendations: see above.  3. Referral(s): [place] (In Clinic) 4. "From scale of 1-10, how likely are you to follow plan?":   Bayard Hugger, LCSW

## 2018-06-23 ENCOUNTER — Ambulatory Visit (INDEPENDENT_AMBULATORY_CARE_PROVIDER_SITE_OTHER)
Admission: RE | Admit: 2018-06-23 | Discharge: 2018-06-23 | Disposition: A | Discharge status: Home / Self Care | Payer: Self-pay | Source: Ambulatory Visit

## 2018-06-23 DIAGNOSIS — R42 Dizziness and giddiness: Secondary | ICD-10-CM

## 2018-06-23 DIAGNOSIS — R531 Weakness: Secondary | ICD-10-CM

## 2018-06-23 DIAGNOSIS — R682 Dry mouth, unspecified: Secondary | ICD-10-CM

## 2018-06-23 DIAGNOSIS — R51 Headache: Secondary | ICD-10-CM

## 2018-06-23 DIAGNOSIS — R519 Headache, unspecified: Secondary | ICD-10-CM

## 2018-06-23 NOTE — ED Provider Notes (Signed)
Virtual Visit via Video Note:  Taylor Bryant  initiated request for Telemedicine visit with [facility] team. I connected with Taylor Bryant  on 06/24/2018 at 8:18 AM  for a synchronized telemedicine visit using a video enabled HIPPA compliant telemedicine application. I verified that I am speaking with Taylor Bryant  using two identifiers. Taylor July, NP  was physically located in a [facility] site and Taylor Bryant was located at a different location.   The limitations of evaluation and management by telemedicine as well as the availability of in-person appointments were discussed. Patient was informed that he  may incur a bill ( including co-pay) for this virtual visit encounter. Taylor Bryant  expressed understanding and gave verbal consent to proceed with virtual visit.     History of Present Illness:Taylor Bryant  is a 38 y.o. male presents with Headache, body aches, dry mouth, weakness. Symptoms have been ongoing.  He is worried about diabetes. He has already been seen by his primary care for this. Urinalysis was normal.  No fever, cough. He is scheduled Thursday for blood work by PCP. He recently had a CT scan.  No chest pain, SOB.   Past Medical History:  Diagnosis Date  . Bicuspid aortic valve   . Heart murmur   . Hyperlipidemia   . Schizoaffective disorder ([place])   . Von Willebrand disease ([place])     Allergies  Allergen Reactions  . Penicillins Rash        Observations/Objective:GENERAL APPEARANCE: Well developed, well nourished, alert and cooperative, and appears to be in no acute distress. HEAD: normocephalic. Non labored breathing, no dyspnea or distress Skin: Skin normal color  PSYCHIATRIC: The mental examination revealed the patient was oriented to person, place, and time. The patient was able to demonstrate good judgement and reason, without hallucinations, abnormal affect or abnormal behaviors during the examination. Patient is not suicidal     Assessment and  Plan: Not  Convinced this is diabetes. He already has been seen by PCP for same. follow up with PCP or go to the ER for worsening symptoms.    Follow Up Instructions: Follow up as needed for continued or worsening symptoms     I discussed the assessment and treatment plan with the patient. The patient was provided an opportunity to ask questions and all were answered. The patient agreed with the plan and demonstrated an understanding of the instructions.   The patient was advised to call back or seek an in-person evaluation if the symptoms worsen or if the condition fails to improve as anticipated.     Taylor July, NP  06/24/2018 8:18 AM         Taylor July, NP 06/24/18 [phone]

## 2018-06-23 NOTE — Discharge Instructions (Addendum)
I am not convinced this is diabetes. Recent urine that was done at your primary was normal Make sure you follow-up with your primary care on Thursday for blood work that scheduled  If your headache, dizziness, weakness continues you may want to have a CT of your head done This can be also ordered by your primary care If your symptoms worsen you need to go to the ER.

## 2018-06-26 ENCOUNTER — Other Ambulatory Visit: Payer: Self-pay

## 2018-06-26 ENCOUNTER — Ambulatory Visit: Payer: Self-pay | Admitting: Licensed Clinical Social Worker

## 2018-06-26 DIAGNOSIS — R399 Unspecified symptoms and signs involving the genitourinary system: Secondary | ICD-10-CM

## 2018-06-26 DIAGNOSIS — F410 Panic disorder [episodic paroxysmal anxiety] without agoraphobia: Secondary | ICD-10-CM

## 2018-06-26 DIAGNOSIS — E785 Hyperlipidemia, unspecified: Secondary | ICD-10-CM

## 2018-06-26 DIAGNOSIS — F411 Generalized anxiety disorder: Secondary | ICD-10-CM

## 2018-06-26 DIAGNOSIS — F333 Major depressive disorder, recurrent, severe with psychotic symptoms: Secondary | ICD-10-CM

## 2018-06-26 NOTE — BH Specialist Note (Signed)
Integrated Behavioral Health Follow Up Visit Via Phone  MRN: [id number] Name: Taylor Bryant  Type of Service: [place] Interpretor:No. Interpretor Name and Language: Not applicable.  SUBJECTIVE: Taylor Bryant is a 38 y.o. male accompanied by himself. Patient was referred by Staci Acosta NP for mental health. Patient reports the following symptoms/concerns: He reports that he had a rough week this week. He notes that Monday he had the worst headache of his life and had to have a video visit with urgent care. He described having this fear that the headaches are a sign of diabetes and he worries about it because his grandmother died due to complications with diabetes. He notes that any time he has some type of physical symptom or health issue that it unexplained that his anxiety increased and he started to become paranoid that something bad is going to happen. He notes that he does better in terms of his anxiety and depression symptoms when he is working and staying busy. He denies suicidal and homicidal thoughts. Duration of problem: ; Severity of problem: moderate  OBJECTIVE: Mood: Euthymic and Affect: Appropriate Risk of harm to self or others: No plan to harm self or others  LIFE CONTEXT: Family and Social: See above. School/Work: See above. Self-Care: See above. Life Changes: See above.  GOALS ADDRESSED: Patient will: 1.  Reduce symptoms of: anxiety  2.  Increase knowledge and/or ability of: coping skills, healthy habits, self-management skills and stress reduction  3.  Demonstrate ability to: Increase healthy adjustment to current life circumstances  INTERVENTIONS: Interventions utilized:  Brief CBT was utilized the clinician focusing on the patient's anxiety and affect on normal cognition. Clinician discussed with the patient the factors that are contributing to his symptoms of anxiety. Clinician explained to the patient that when he is anxious  that he will tend to become overwhelmed and have irrational thoughts about his healthy, personal life, work life, etc. Clinician explained to the patient that it sounds like he needs to have a routine for the days that he has off in terms of exercise, cooking, laundry, projects around the house, and getting adequate rest. Clinician encouraged the patient to continue to utilize his coping skills and grounding techniques when feeling anxious.  Standardized Assessments completed: next session  ASSESSMENT: Patient currently experiencing see above.  Patient may benefit from see above.  PLAN: 1. Follow up with behavioral health clinician on : one week or earlier if needed. 2. Behavioral recommendations: see above. 3. Referral(s): [place] (In Clinic) 4. "From scale of 1-10, how likely are you to follow plan?":   Bayard Hugger, LCSW

## 2018-06-27 LAB — URINALYSIS
Bilirubin, UA: NEGATIVE
Glucose, UA: NEGATIVE
Ketones, UA: NEGATIVE
Leukocytes,UA: NEGATIVE
Nitrite, UA: NEGATIVE
Protein,UA: NEGATIVE
RBC, UA: NEGATIVE
Specific Gravity, UA: 1.012 (ref 1.005–1.030)
Urobilinogen, Ur: 0.2 mg/dL (ref 0.2–1.0)
pH, UA: 6.5 (ref 5.0–7.5)

## 2018-06-27 LAB — LIPID PANEL
Chol/HDL Ratio: 5 ratio (ref 0.0–5.0)
Cholesterol, Total: 194 mg/dL (ref 100–199)
HDL: 39 mg/dL — ABNORMAL LOW (ref >39)
LDL Calculated: 120 mg/dL — ABNORMAL HIGH (ref 0–99)
Triglycerides: 174 mg/dL — ABNORMAL HIGH (ref 0–149)
VLDL Cholesterol Cal: 35 mg/dL (ref 5–40)

## 2018-07-03 ENCOUNTER — Other Ambulatory Visit: Payer: Self-pay

## 2018-07-03 ENCOUNTER — Ambulatory Visit: Payer: Self-pay | Admitting: Licensed Clinical Social Worker

## 2018-07-03 DIAGNOSIS — F333 Major depressive disorder, recurrent, severe with psychotic symptoms: Secondary | ICD-10-CM

## 2018-07-03 DIAGNOSIS — F411 Generalized anxiety disorder: Secondary | ICD-10-CM

## 2018-07-03 DIAGNOSIS — F410 Panic disorder [episodic paroxysmal anxiety] without agoraphobia: Secondary | ICD-10-CM

## 2018-07-03 NOTE — BH Specialist Note (Signed)
Integrated Behavioral Health Follow Up Visit via phone  MRN: [id number] Name: Taylor Bryant  Type of Service: [place] Interpretor:No. Interpretor Name and Language: Not applicable.   SUBJECTIVE: Taylor Bryant is a 38 y.o. male accompanied by himself. Patient was referred by Staci Acosta NP for mental health. Patient reports the following symptoms/concerns: He reports that he has not been doing well this week compared to last week. He reports that he had a argument with a co worker on Tuesday and the co worker coughed on him in retaliation. He describes having to call out of work for the last three days to head aches and not feeling right. He reports that he spoke with his dad and his dad believes that this symptoms are not due to illness and has more to do with his anxiety. He reports that his attitude has been different the last couple of days and other people have noticed. He reports that he has having five to six panic attacks a day. He reports that the worst panic attacks occur at night. He reports that when the panic attacks are intense at night that he is unable to function the next day. He describes having chills and alternating with hot flashes. He denies suicidal and homicidal thoughts. Duration of problem: ; Severity of problem: moderate  OBJECTIVE: Mood: Anxious and Affect: Appropriate Risk of harm to self or others: No plan to harm self or others  LIFE CONTEXT: Family and Social: See above. School/Work: See above. Self-Care: See above. Life Changes: See above.   GOALS ADDRESSED: Patient will: 1.  Reduce symptoms of: anxiety  2.  Increase knowledge and/or ability of: coping skills, healthy habits, self-management skills and stress reduction  3.  Demonstrate ability to: Increase healthy adjustment to current life circumstances  INTERVENTIONS: Interventions utilized:  Brief CBT was utilized by the clinician focusing on anxiety and affect on  normal cognition. Clinician processed with the patient regarding how he has been doing since the last follow up session. Clinician discussed with the patient the stressors he has been experiencing that have contributed to his symptoms of anxiety. Clinician provided psycho education that when you are feeling anxious and depression that most people can experience somatic symptoms like he is describing. Clinician explained to the patient that the panic attacks can make a person feel like they have run marathon due to adrenaline. Clinician suggested that the patient try an app called Sleep Stories around bed time to help him be more relaxed and have a restless sleep. Clinician suggested that the patient download an coping skills app to help him be able to relax and practice them when he is not feeling anxious so when he does that these skills will come back to him.  Standardized Assessments completed: GAD-7 and PHQ 9  ASSESSMENT: Patient currently experiencing see above.   Patient may benefit from see above.  PLAN: 1. Follow up with behavioral health clinician on : Follow up in one week or earlier if needed. 2. Behavioral recommendations: Case consultation with Dr. Octavia Heir, MD on Tuesday 07/08/2018 @ 9 am to discuss patient's symptoms and a plan moving forward.  3. Referral(s): [place] (In Clinic) 4. "From scale of 1-10, how likely are you to follow plan?": [place], LCSW

## 2018-07-07 ENCOUNTER — Ambulatory Visit (INDEPENDENT_AMBULATORY_CARE_PROVIDER_SITE_OTHER)
Admission: RE | Admit: 2018-07-07 | Discharge: 2018-07-07 | Disposition: A | Discharge status: Home / Self Care | Payer: Self-pay | Source: Ambulatory Visit

## 2018-07-07 ENCOUNTER — Telehealth: Payer: Self-pay | Admitting: *Deleted

## 2018-07-07 DIAGNOSIS — J069 Acute upper respiratory infection, unspecified: Secondary | ICD-10-CM

## 2018-07-07 DIAGNOSIS — B9789 Other viral agents as the cause of diseases classified elsewhere: Secondary | ICD-10-CM

## 2018-07-07 MED ORDER — BENZONATATE 200 MG PO CAPS: 200.0000 mg | ORAL_CAPSULE | Freq: Three times a day (TID) | ORAL | 0 refills | Status: AC | PRN

## 2018-07-07 MED ORDER — OLOPATADINE HCL 0.1 % OP SOLN: 1.0000 [drp] | Freq: Two times a day (BID) | OPHTHALMIC | 12 refills | Status: DC

## 2018-07-07 MED ORDER — CETIRIZINE HCL 10 MG PO CAPS: 10.0000 mg | ORAL_CAPSULE | Freq: Every day | ORAL | 0 refills | Status: DC

## 2018-07-07 MED ORDER — FLUTICASONE PROPIONATE 50 MCG/ACT NA SUSP: 1.0000 | Freq: Every day | NASAL | 2 refills | Status: DC

## 2018-07-07 NOTE — Telephone Encounter (Signed)
Patient attempted at the number listed in chart, received a fast busy signal. Will route to referring provider for resolution.

## 2018-07-07 NOTE — Telephone Encounter (Signed)
-----   Message from Janith Lima, PA-C sent at 07/07/2018 10:15 AM EDT ----- Regarding: Needs COVID testing URI symptoms, fatigue, headaches and diarrhea, cough x4 to 5 days.  Works at [employer].  No known exposure. Requesting [place] site

## 2018-07-07 NOTE — ED Provider Notes (Signed)
Virtual Visit via Video Note:  Taylor Bryant  initiated request for Telemedicine visit with [facility]. I connected with Taylor Bryant  on 07/07/2018 at 4:10 PM  for a synchronized telemedicine visit using a video enabled HIPPA compliant telemedicine application. I verified that I am speaking with Taylor Bryant  using two identifiers. Taylor Vinje C Jyles Sontag, PA-C  was physically located in a [facility] site and Taylor Bryant was located at a different location.   The limitations of evaluation and management by telemedicine as well as the availability of in-person appointments were discussed. Patient was informed that he  may incur a bill ( including co-pay) for this virtual visit encounter. Taylor Bryant  expressed understanding and gave verbal consent to proceed with virtual visit.     History of Present Illness:Taylor Bryant  is a 38 y.o. male presents with concern over potential COVID symptoms.  Patient states that last Wednesday he developed a headache.  The following day he had diarrhea and cough.  Since he has also developed nasal congestion as well as itchy eyes.  Symptoms have been going on over the past 4 to 5 days.  He has had normal temperatures which has been measured at his work.  His main concern is that he is felt extremely tired and had a lot of fatigue and felt weak.  He has had hot and cold chills, but no known fevers.  He does work at [employer], but no known COVID exposures.  He is an active tobacco user.  Notes his weakness is mainly in bilateral upper extremities.  Denies one-sided weakness.  Denies vision changes or nausea or vomiting associated with headache.  Past Medical History:  Diagnosis Date  . Bicuspid aortic valve   . Heart murmur   . Hyperlipidemia   . Schizoaffective disorder (HCC)   . Von Willebrand disease (HCC)     Allergies  Allergen Reactions  . Penicillins Rash        Observations/Objective: Physical Exam  Constitutional: He is oriented to person,  place, and time and well-developed, well-nourished, and in no distress. No distress.  HENT:  Head: Normocephalic and atraumatic.  Eyes: Conjunctivae are normal.  Neck: Normal range of motion. Neck supple.  Moving neck appropriately  Pulmonary/Chest: Effort normal. No respiratory distress.  Speaking in full sentences, no coughing during video visit  Musculoskeletal:     Comments: Moving extremities appropriately  Neurological: He is alert and oriented to person, place, and time.  Speech clear, face symmetric     Assessment and Plan: Patient with URI symptoms x4 to 5 days.  No known fevers.  Will recommend [facility]  Recommended quarantining until results returned.  Recommending symptomatic and supportive care.  Rest, push fluids,Discussed strict return precautions. Patient verbalized understanding and is agreeable with plan.    ICD-10-CM   1. Viral URI with cough  J06.9    B97.89      Follow Up Instructions:    I discussed the assessment and treatment plan with the patient. The patient was provided an opportunity to ask questions and all were answered. The patient agreed with the plan and demonstrated an understanding of the instructions.   The patient was advised to call back or seek an in-person evaluation if the symptoms worsen or if the condition fails to improve as anticipated.      Lew DawesHallie C Heitor Steinhoff, PA-C  07/07/2018 4:10 PM  Janith Lima, [place] 07/07/18 [phone]

## 2018-07-07 NOTE — Discharge Instructions (Addendum)
Someone will be in contact with you to set up appointment for COVID testing-this will be at [facility] over building Please stay at home quarantine until results return  Please begin daily cetirizine/Zyrtec to help with congestion and drainage/itching May use olopatadine eyedrops for further itching twice a day Flonase nasal spray 1 to 2 sprays in each nostril daily to further help with congestion Tessalon every 8 hours for cough Tylenol for any fevers and body aches Rest and drink plenty of fluids  Please follow-up if developing worsening symptoms, fevers, difficulty breathing, shortness of breath, persistent symptoms

## 2018-07-07 NOTE — Telephone Encounter (Signed)
Attempted to call patient to schedule covid testing [phone] and number is a fast busy signal.

## 2018-07-08 ENCOUNTER — Telehealth: Payer: Self-pay | Admitting: *Deleted

## 2018-07-08 ENCOUNTER — Other Ambulatory Visit: Payer: Self-pay

## 2018-07-08 DIAGNOSIS — Z20822 Contact with and (suspected) exposure to covid-19: Secondary | ICD-10-CM

## 2018-07-08 NOTE — Telephone Encounter (Signed)
Pt returned call for scheduling test for COVID-19 test.  Appointment scheduled and order placed.

## 2018-07-08 NOTE — Addendum Note (Signed)
Addended by: Fransisca Connors A on: 07/08/2018 02:05 PM   Modules accepted: Orders

## 2018-07-08 NOTE — Telephone Encounter (Signed)
[facility] called to refer him for testing for covid-19. He is symptomatic.  Attempted to call patient to be scheduled for covid-19 testing. Phone had a fast busy signal, unable to complete call.

## 2018-07-10 ENCOUNTER — Other Ambulatory Visit: Payer: Self-pay

## 2018-07-10 ENCOUNTER — Ambulatory Visit: Payer: Self-pay | Admitting: Adult Health Nurse Practitioner

## 2018-07-10 ENCOUNTER — Ambulatory Visit: Payer: Self-pay | Admitting: Licensed Clinical Social Worker

## 2018-07-10 DIAGNOSIS — F410 Panic disorder [episodic paroxysmal anxiety] without agoraphobia: Secondary | ICD-10-CM

## 2018-07-10 DIAGNOSIS — E785 Hyperlipidemia, unspecified: Secondary | ICD-10-CM | POA: Insufficient documentation

## 2018-07-10 DIAGNOSIS — F411 Generalized anxiety disorder: Secondary | ICD-10-CM

## 2018-07-10 DIAGNOSIS — F333 Major depressive disorder, recurrent, severe with psychotic symptoms: Secondary | ICD-10-CM

## 2018-07-10 NOTE — Progress Notes (Signed)
  Patient: Taylor Bryant Male    DOB: Aug 12, 1980   38 y.o.   MRN: [id number] Visit Date: 07/10/2018  Today's Provider: Staci Acosta, NP   No chief complaint on file.  Subjective:    HPI    Telephonic visit.   States that he has increased anxiety tonight. Had visit with Heather today.  States that he is awaiting the results for his COVID testing that was done on Tuesday.  States that an employee coughed on him last week- reports a bad HA on Tues/Wed last week and then developed diarrhea.  States that he has been self- quarantine since Monday.   LDL- 120 down form 170.  States that he is taking his medications as directed and making healthy lifestyle changes.       Allergies  Allergen Reactions  . Penicillins Rash   Previous Medications   ATORVASTATIN (LIPITOR) 40 MG TABLET    Take 1 tablet (40 mg total) by mouth daily at 6 PM.   BENZONATATE (TESSALON) 200 MG CAPSULE    Take 1 capsule (200 mg total) by mouth 3 (three) times daily as needed for up to 7 days for cough.   BUSPIRONE (BUSPAR) 10 MG TABLET    Take 1 tablet (10 mg total) by mouth 3 (three) times daily for 30 days.   CETIRIZINE HCL 10 MG CAPS    Take 1 capsule (10 mg total) by mouth daily for 10 days.   DULOXETINE (CYMBALTA) 30 MG CAPSULE    Take 3 capsules (90 mg total) by mouth daily for 30 days.   FLUTICASONE (FLONASE) 50 MCG/ACT NASAL SPRAY    Place 1-2 sprays into both nostrils daily.   LITHIUM CARBONATE (ESKALITH) 450 MG CR TABLET    Take 1 tablet (450 mg total) by mouth 3 (three) times daily.   METHYLCELLULOSE ORAL POWDER    Take by mouth daily.   NON FORMULARY    CBD tablet qd.   OLOPATADINE (PATANOL) 0.1 % OPHTHALMIC SOLUTION    Place 1 drop into both eyes 2 (two) times daily. For eye itching   TADALAFIL (CIALIS) 5 MG TABLET    Take 1 tablet (5 mg total) by mouth daily as needed for up to 30 days for erectile dysfunction.    Review of Systems  Social History   Tobacco Use  . Smoking status: Former Smoker     Packs/day: 0.25    Years: 16.00    Pack years: 4.00    Types: Cigarettes    Quit date: 2016    Years since quitting: 4.5  . Smokeless tobacco: Never Used  Substance Use Topics  . Alcohol use: Not Currently   Objective:   There were no vitals taken for this visit.  Physical Exam  No PE.     Assessment & Plan:         HLD:  Improved.  Continue current regimen.  Encourage low cholesterol, low fat diet and exercise.   Discussed that his COVID testing is not back yet and they will call him with results.  Offered reassurance.  Continue to FU with Heather.     Staci Acosta, NP   [facility]

## 2018-07-10 NOTE — BH Specialist Note (Signed)
Integrated Behavioral Health Follow Up Visit Via Phone  MRN: [id number] Name: Taylor Bryant  Type of Service: [place] Interpretor:No. Interpretor Name and Language: Not applicable.  SUBJECTIVE: Taylor Bryant is a 38 y.o. male accompanied by himself. Patient was referred by Staci Acosta NP for mental health. Patient reports the following symptoms/concerns: He reports that he has been having a very difficult week because he had to take a test to determine whether or not he has the virus due to experiencing symptoms. He explains that he is stressed that if he has the virus that he will have to quarantine for 14 days, not be able to work, and not having enough money to pay his bills. He explains that he is frustrated because he feels like people in and outside of work are not following all of the [place] 19 guidelines with phase II in [place]. He denies suicidal and homicidal thoughts.  Duration of problem: ; Severity of problem: moderate  OBJECTIVE: Mood: Anxious and Affect: Appropriate Risk of harm to self or others: No plan to harm self or others  LIFE CONTEXT: Family and Social: See above. School/Work: See above. Self-Care: See above. Life Changes: See above.  GOALS ADDRESSED: Patient will: 1.  Reduce symptoms of: anxiety  2.  Increase knowledge and/or ability of: coping skills, healthy habits, self-management skills and stress reduction  3.  Demonstrate ability to: Increase healthy adjustment to current life circumstances  INTERVENTIONS: Interventions utilized:  Brief CBT was utilized by the clinician focusing on the patient's anxiety and affect on normal cognition. Clinician processed with the patient regarding how he has been doing since the last follow up session. Clinician explained to the patient that worse case scenario if he does have the virus, he will not be able to work and have to quarantine for two weeks. Clinician explained to the patient  that she wants him to think of the most likely scenario which is that he probably has a virus but not the coronavirus and his GI issues are possibly better explained by his symptoms of anxiety. Clinician explained to the patient that she wants him to think in a positive manner as opposed to negative manner. Clinician encouraged the patient to continue to utilize his coping skills and grounding techniques.  Standardized Assessments completed: next session  ASSESSMENT: Patient currently experiencing see above.   Patient may benefit from see above.  PLAN: 1. Follow up with behavioral health clinician on : one week or earlier if needed. 2. Behavioral recommendations: see above. 3. Referral(s): [place] (In Clinic) 4. "From scale of 1-10, how likely are you to follow plan?":   Bayard Hugger, LCSW

## 2018-07-15 LAB — NOVEL CORONAVIRUS, NAA: SARS-CoV-2, NAA: NOT DETECTED

## 2018-07-17 ENCOUNTER — Ambulatory Visit: Payer: Self-pay | Admitting: Licensed Clinical Social Worker

## 2018-07-17 ENCOUNTER — Other Ambulatory Visit: Payer: Self-pay

## 2018-07-17 DIAGNOSIS — F333 Major depressive disorder, recurrent, severe with psychotic symptoms: Secondary | ICD-10-CM

## 2018-07-17 DIAGNOSIS — F410 Panic disorder [episodic paroxysmal anxiety] without agoraphobia: Secondary | ICD-10-CM

## 2018-07-17 DIAGNOSIS — F411 Generalized anxiety disorder: Secondary | ICD-10-CM

## 2018-07-17 NOTE — BH Specialist Note (Signed)
Integrated Behavioral Health Follow Up Visit Via Phone  MRN: [id number] Name: Nafis Farnan  Type of Service: [place] Interpretor:No. Interpretor Name and Language: not applicable.  SUBJECTIVE: Kairon Shock is a 38 y.o. male accompanied by himself. Patient was referred by Staci Acosta NP for mental health. Patient reports the following symptoms/concerns: He reports that he is relieved that he tested negative for COVID 19 and has been able to go back to work. He reports that he is looking forward to working for the next six days straight so he has plenty of distractions to keep his mind occupied from the anxiety.  He reports that he is trying to stay positive and hopeful that things will improve. He describes feeling manic as evidenced by racing thoughts, lots of energy, and feeling impulsive. He notes that he is trying to focus on staying at his apartment not going anywhere to spend money and risk doing something stupid. He denies suicidal and homicidal thoughts.  Duration of problem: ; Severity of problem: moderate  OBJECTIVE: Mood: Euthymic and Affect: Appropriate Risk of harm to self or others: No plan to harm self or others  LIFE CONTEXT: Family and Social: See above. School/Work: See above. Self-Care: See above. Life Changes: See above.  GOALS ADDRESSED: Patient will: 1.  Reduce symptoms of: anxiety  2.  Increase knowledge and/or ability of: coping skills, self-management skills and stress reduction  3.  Demonstrate ability to: Increase healthy adjustment to current life circumstances  INTERVENTIONS: Interventions utilized:  Brief CBT was utilized by the clinician focusing on the patient's anxiety and affect on normal cognition. Clinician processed with the patient regarding how he has been doing since the last follow up session. Clinician explained to the patient that she is glad to hear that he tested negative for COVID 19. Clinician  explained to the patient that if he has to leave his apartment today, take cash, leave his debit/credit card at home, and turn off notifications on his phone for [place], [place], etc so he is not tempted to spend money. Clinician encouraged the patient to set healthy boundaries with his friends and family as well that he is not in a good place today. Clinician suggested that the patient shift his energy to spending time with his daughter, cleaning and organizing his apartment, and running errands. Clinician encouraged the patient to continue to utilize his coping skills.  Standardized Assessments completed: GAD-7 and PHQ 9  ASSESSMENT: Patient currently experiencing see above.   Patient may benefit from see above.  PLAN: 1. Follow up with behavioral health clinician on : one week or earlier if needed. 2. Behavioral recommendations: See above. 3. Referral(s): [place] (In Clinic) 4. "From scale of 1-10, how likely are you to follow plan?":   Bayard Hugger, LCSW

## 2018-07-24 ENCOUNTER — Ambulatory Visit: Payer: Self-pay | Admitting: Licensed Clinical Social Worker

## 2018-07-24 ENCOUNTER — Other Ambulatory Visit: Payer: Self-pay

## 2018-07-24 DIAGNOSIS — F333 Major depressive disorder, recurrent, severe with psychotic symptoms: Secondary | ICD-10-CM

## 2018-07-24 DIAGNOSIS — F411 Generalized anxiety disorder: Secondary | ICD-10-CM

## 2018-07-24 DIAGNOSIS — F410 Panic disorder [episodic paroxysmal anxiety] without agoraphobia: Secondary | ICD-10-CM

## 2018-07-24 NOTE — BH Specialist Note (Signed)
Integrated Behavioral Health Follow Up Visit Via Phone  MRN: [id number] Name: Taylor Bryant  Type of Service: [place] Interpretor:No. Interpretor Name and Language: Not applicable.  SUBJECTIVE: Taylor Bryant is a 38 y.o. male accompanied by himself. Patient was referred by Staci Acosta NP for mental health. Patient reports the following symptoms/concerns: He reports that he is surprised that he has worked six days in a row without incident or getting too overwhelmed. He notes that he has done pretty well this week. He explains that he is trying to stay away from watching the news, Internet in general, and social media because it causes him to become stressed out. He explains that he realizes that he will anxiety all his life but he needs to learn to live with it and how to manage it. He reports that he has noticed an overall difference in his mood and anxiety since he has been working, has a routine, and is doing something productive each day. He explains that there are times that his patience is tested at work but he tries to not let it get to him. He denies suicidal and homicidal thoughts.  Duration of problem: ; Severity of problem: moderate  OBJECTIVE: Mood: Euthymic and Affect: Appropriate Risk of harm to self or others: No plan to harm self or others  LIFE CONTEXT: Family and Social: See above. School/Work: See above. Self-Care: See above. Life Changes: See above.  GOALS ADDRESSED: Patient will: 1.  Reduce symptoms of: anxiety  2.  Increase knowledge and/or ability of: coping skills, healthy habits and stress reduction  3.  Demonstrate ability to: Increase healthy adjustment to current life circumstances  INTERVENTIONS: Interventions utilized:  Brief CBT was utilized by the clinician focusing on the patient's anxiety and affect on normal cognition. Clinician processed with the patient regarding how he has been doing since the last follow up  session. Clinician explained to the patient that she is glad to hear that he had a better week at work so far compared to last week. Clinician agreed with the patient that he needs to stay off of social media and avoid watching the news. Clinician encouraged the patient to set healthy boundaries with co workers, friends, and family when he is feeling stressed and overwhelmed. Clinician encouraged the patient to continue to utilize the coping skills discussed at previous sessions.  Standardized Assessments completed: next session  ASSESSMENT: Patient currently experiencing see above.   Patient may benefit from see above .  PLAN: 1. Follow up with behavioral health clinician on : one week or earlier if needed. 2. Behavioral recommendations: see above. 3. Referral(s): [place] (In Clinic) 4. "From scale of 1-10, how likely are you to follow plan?":   Bayard Hugger, LCSW

## 2018-07-27 ENCOUNTER — Ambulatory Visit (INDEPENDENT_AMBULATORY_CARE_PROVIDER_SITE_OTHER)
Admission: RE | Admit: 2018-07-27 | Discharge: 2018-07-27 | Disposition: A | Discharge status: Home / Self Care | Payer: Self-pay | Source: Ambulatory Visit

## 2018-07-27 DIAGNOSIS — F3130 Bipolar disorder, current episode depressed, mild or moderate severity, unspecified: Secondary | ICD-10-CM

## 2018-07-27 NOTE — ED Provider Notes (Signed)
Virtual Visit via Video Note:  Taylor Bryant  initiated request for Telemedicine visit with [facility] team. I connected with Taylor Bryant  on 07/27/2018 at 4:00 PM  for a synchronized telemedicine visit using a video enabled HIPPA compliant telemedicine application. I verified that I am speaking with Taylor Bryant  using two identifiers. [place] Hall-Potvin, PA-C  was physically located in a [place] [facility] site and Taylor Bryant was located at a different location.   The limitations of evaluation and management by telemedicine as well as the availability of in-person appointments were discussed. Patient was informed that he  may incur a bill ( including co-pay) for this virtual visit encounter. Taylor Bryant  expressed understanding and gave verbal consent to proceed with virtual visit.     History of Present Illness:Taylor Bryant  is a 38 y.o. male with history of bipolar disorder, schizoaffective disorder, von Willebrand's disease, bicuspid aortic valve presents with acute concern of possible manic episode.  Patient states for the last few days he has felt "like I am on the verge of being manic ".  Patient endorsing aggression and insomnia for the last 2 days.  Patient has been "aggressive with people at work ".  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  Patient denies SI/HI, is routinely followed by psychologist, though is unable to give his provider's full name.  Reports compliance with lithium.  Patient was unsure to go to on the weekends, requesting note for him to return to work in a few days.  Past Medical History:  Diagnosis Date  . Bicuspid aortic valve   . Heart murmur   . Hyperlipidemia   . Schizoaffective disorder ([place])   . Von Willebrand disease ([place])     Allergies  Allergen Reactions  . Penicillins Rash        Observations/Objective: 38 year old male sitting in no acute distress.  Able to speak in full sentences without coughing, wheezing, sneezing.  Patient appears  to be lucid, oriented.  Does seem mildly anxious.    Assessment and Plan: Possible manic episode in setting of bipolar disorder given agitation, aggression, insomnia.  Discussed patient needing in person psych evaluation for return to work.  Follow Up Instructions: Patient states he feels he is able to self transfer to ER for psych evaluation.   I discussed the assessment and treatment plan with the patient. The patient was provided an opportunity to ask questions and all were answered. The patient agreed with the plan and demonstrated an understanding of the instructions.   The patient was advised to call back or seek an in-person evaluation if the symptoms worsen or if the condition fails to improve as anticipated.  I provided 20 minutes of non-face-to-face time during this encounter.    [place], PA-C  07/27/2018 4:00 PM        Hall-Potvin, [place], [place] 07/28/18 1437

## 2018-07-28 NOTE — Discharge Instructions (Addendum)
Recommend he go to the ER for further psychiatric evaluation and management for appropriate return to work precautions.

## 2018-07-31 ENCOUNTER — Other Ambulatory Visit: Payer: Self-pay

## 2018-07-31 ENCOUNTER — Ambulatory Visit: Payer: Self-pay | Admitting: Licensed Clinical Social Worker

## 2018-07-31 DIAGNOSIS — F411 Generalized anxiety disorder: Secondary | ICD-10-CM

## 2018-07-31 DIAGNOSIS — F333 Major depressive disorder, recurrent, severe with psychotic symptoms: Secondary | ICD-10-CM

## 2018-07-31 DIAGNOSIS — F410 Panic disorder [episodic paroxysmal anxiety] without agoraphobia: Secondary | ICD-10-CM

## 2018-07-31 NOTE — BH Specialist Note (Signed)
Integrated Behavioral Health Follow Up Visit Via Phone  MRN: [id number] Name: Taylor Bryant  Type of Service: [place] Interpretor:No. Interpretor Name and Language: Not applicable.  SUBJECTIVE: Taylor Bryant is a 38 y.o. male accompanied by himself. Patient was referred by Staci Acosta NP for mental health. Patient reports the following symptoms/concerns: He reports that he became very anxiety and panic attacks increased on Sunday and ended up contacting urgent care. He explains that his friends and family threatened to have him involuntarily committed because he wasn't doing well at all. He reports that he is taking the Lithium, Cymbalta, and Buspirone as prescribed. He describes feeling aggressive towards ex wife, family, and co workers. He explains that he feels like he is on the verge of mania due to experiencing insomnia and aggression that is out of proportion to the event or stressor. He notes that work told him to take some time and get himself together before he can return to work. He reached out to urgent care for help and requesting documentation to be able to return to work. He denies suicidal and homicidal thoughts.  Duration of problem: ; Severity of problem: severe  OBJECTIVE: Mood: Euthymic and Affect: Appropriate Risk of harm to self or others: No plan to harm self or others  LIFE CONTEXT: Family and Social: [name] reports that he spoke with his dad who told him that he has a condition and right now his symptoms are exacerbated by stress that he is experiencing at work, his ex wife, etc.  School/Work: See above. Self-Care: See above. Life Changes: See above.  GOALS ADDRESSED: Patient will: 1.  Reduce symptoms of: anxiety and mood instability  2.  Increase knowledge and/or ability of: coping skills, healthy habits, self-management skills and stress reduction  3.  Demonstrate ability to: Increase healthy adjustment to current life  circumstances  INTERVENTIONS: Interventions utilized:  Brief CBT was utilized by the clinician focusing on the patient's stress and affect on normal cognition. Clinician processed with the patient regarding how he has been doing since the last follow up session. Clinician discussed with the patient that events that took place this past Sunday that caused him to reach out to urgent care for help. Clinician asked the  patient if he has been compliant with his psychotropic medications. Clinician provided psycho education regarding hypo mania versus mania. Clinician explained to the patient that she thinks that he is putting too much pressure on himself because he wants to be a perfectionist at his job, move up in positions, and be successful. Clinician explained to the patient that he is not managing his anxiety so it manifests as anger and aggression. Clinician explained to the patient that he has to practice self care, utilize his coping skills, and have healthy outlets to channel his emotions. Clinician explained that she thinks that he is not on the verge of hospitalization, psychotic, or reckless. Clinician discussed with the patient that she will discuss what his experiencing with the Dr. Octavia Heir, MD, psychiatric consultant regarding his medications and ordering possible labs.  Standardized Assessments completed: GAD-7 and PHQ 9  ASSESSMENT: Patient currently experiencing see above.   Patient may benefit from see above.  PLAN: 1. Follow up with behavioral health clinician on : one week or earlier if needed. 2. Behavioral recommendations: see above. 3. Referral(s): [place] (In Clinic) 4. "From scale of 1-10, how likely are you to follow plan?":   Bayard Hugger, LCSW

## 2018-08-07 ENCOUNTER — Other Ambulatory Visit: Payer: Self-pay

## 2018-08-07 ENCOUNTER — Ambulatory Visit: Payer: Self-pay | Admitting: Licensed Clinical Social Worker

## 2018-08-07 DIAGNOSIS — F3162 Bipolar disorder, current episode mixed, moderate: Secondary | ICD-10-CM

## 2018-08-07 DIAGNOSIS — F410 Panic disorder [episodic paroxysmal anxiety] without agoraphobia: Secondary | ICD-10-CM

## 2018-08-07 DIAGNOSIS — F411 Generalized anxiety disorder: Secondary | ICD-10-CM

## 2018-08-07 NOTE — BH Specialist Note (Signed)
Integrated Behavioral Health Follow Up Visit Via Phone  MRN: [id number] Name: Taylor Bryant  Type of Service: [place] Interpretor:No. Interpretor Name and Language: Not applicable.   SUBJECTIVE: Taylor Bryant is a 38 y.o. male accompanied by himself. Patient was referred by Staci Acosta NP for mental health. Patient reports the following symptoms/concerns: He reports that he has been doing better this week compared to last week. He explains that he has noticed a difference in a decrease in his insomnia since he started taking the first two dosages of Cymbalta in the morning and the last in the afternoon. He notes that he is trying to avoid triggers for mania and the depression. He reports that he has been working six days straight which he thinks has been good for him to not think about his problems and have distractions. He explains that when he has a day off that its mood, anxiety, and potential for mania increases. He explains that when he has time to over think about things that he will get in trouble. He reports that his anxiety and panic attacks are more manageable this week compared to last week. He notes that he is smoking three to four cigarettes a night and that's all he will smoke in a day. He denies suicidal and homicidal thoughts.  Duration of problem: ; Severity of problem: moderate  OBJECTIVE: Mood: Euthymic and Affect: Appropriate Risk of harm to self or others: No plan to harm self or others  LIFE CONTEXT: Family and Social: See above. School/Work: See above. Self-Care: See above. Life Changes: See above.  GOALS ADDRESSED: Patient will: 1.  Reduce symptoms of: anxiety  2.  Increase knowledge and/or ability of: coping skills  3.  Demonstrate ability to: Increase healthy adjustment to current life circumstances  INTERVENTIONS: Interventions utilized:  Brief CBT was utilized by the clinician focusing on patient's anxiety and affect on  normal cognition. Clinician processed with the patient regarding how he has been doing this week compared to last week. Clinician explained to the patient that it sounds like he has had less insomnia since he is no longer taking his last dosage of Cymbalta in the evening with the Lithium and Buspirone. Clinician explained to the patient that she had a case consultation with Dr. Octavia Heir, MD, psychiatric consultant who instructed her to order a Comprehensive Metabolic panel and a Lithium level. Clinician was instructed to take take his Lithium 12 hours before his scheduled labs on Thursday August 6th @ 6:45 pm. Clinician explained to the patient that its important that he practice self care, utilize healthy outlets, and take things one day at a time. Clinician explained to the patient that she thinks he gets caught up in things that he cannot control versus what he can control in terms of co workers, ex wife, friends, and family. Clinician explained to the patient that it sounds like he needs to work on finding things to do on his days off of work so he is being productive and not causing himself to go into a spiral that is unhealthy.  Standardized Assessments completed: next session.  ASSESSMENT: Patient currently experiencing see above   Patient may benefit from see above.  PLAN: 1. Follow up with behavioral health clinician on :  2. Behavioral recommendations: See above.  A comprehensive metabolic panel and a Lithium level were ordered for this patient to monitor due to his psychotropic medications and six month monitoring of levels. Patient was instructed to not take  Lithium 12 hours before labs. Patient was agreeable to plans and understands instructions.  3. [facility] 4. "From scale of 1-10, how likely are you to follow plan?":   Althia FortsHeather B Vy Badley, LCSW

## 2018-08-14 ENCOUNTER — Other Ambulatory Visit: Payer: Self-pay

## 2018-08-14 ENCOUNTER — Ambulatory Visit: Payer: Self-pay | Admitting: Licensed Clinical Social Worker

## 2018-08-14 DIAGNOSIS — F3162 Bipolar disorder, current episode mixed, moderate: Secondary | ICD-10-CM

## 2018-08-14 DIAGNOSIS — F411 Generalized anxiety disorder: Secondary | ICD-10-CM

## 2018-08-14 DIAGNOSIS — F410 Panic disorder [episodic paroxysmal anxiety] without agoraphobia: Secondary | ICD-10-CM

## 2018-08-14 NOTE — BH Specialist Note (Signed)
Integrated Behavioral Health Follow Up Visit Via Phone  MRN: [id number] Name: Demarious Kapur  Type of Service: [place] Interpretor:No. Interpretor Name and Language: Not applicable.   SUBJECTIVE: Taylor Bryant is a 38 y.o. male accompanied by herself. Patient was referred by Staci Acosta NP for mental health. Patient reports the following symptoms/concerns: He reports that he had a panic attack at work this week out of nowhere and realized that one of his co workers used a detergent that he is allergic to. He notes that he got sick and had to leave work. He reports that he called his dad when he got home, who gave him the news that his brother in [place] aunt passed away from [address] 23 in [place]. 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 He notes that things have calmed down with his ex wife and they are getting along a lot better. He explains that his relationship has also improved with his sister because he has stepped up as a parent, is working, and is more responsible. He describes having an episodes of dissociation/depersonalization as evidenced by feeling as if he is observing himself doing things (outer body type experience). He notes that he has been smoking more cigarettes and is trying to cut down without success. He notes that he is able to fall asleep but wakes up a few times through out the night. He denies suicidal and homicidal thoughts.  Duration of problem: ; Severity of problem: moderate  OBJECTIVE: Mood: Euthymic and Affect: Appropriate Risk of harm to self or others: No plan to harm self or others  LIFE CONTEXT: Family and Social: See above. School/Work: See above. Self-Care: See above. Life Changes: See above.  GOALS ADDRESSED: Patient will: 1.  Reduce symptoms of: anxiety  2.  Increase knowledge and/or ability of: coping skills,  healthy habits, self-management skills and stress reduction  3.  Demonstrate ability to: Increase healthy adjustment to current life circumstances  INTERVENTIONS: Interventions utilized:  Brief CBT was utilized by the clinician focusing on the patient's anxiety and affect on normal cognition. Clinician processed with the patient regarding how she has been doing since the last follow up session. Clinician discussed with the patient the factors contributing to his symptoms of anxiety and panic attacks. Clinician explained to the patient that she thinks that he is handling stressors at work better this week compared to last week. Clinician explained to the patient that she is glad to hear that he is getting along better with his ex wife. Clinician suggested that the patient gradually cut down by one cigarette each week. Clinician encouraged the patient to continue to utilize his coping skills and practice self care.  Standardized Assessments completed: GAD-7 and PHQ 9  ASSESSMENT: Patient currently experiencing see above.   Patient may benefit from see above.  PLAN: 1. Follow up with behavioral health clinician on : one week or earlier if needed. 2. Behavioral recommendations: see above. 3. Referral(s): [place] (In Clinic) 4. "From scale of 1-10, how likely are you to follow plan?":   Bayard Hugger, LCSW

## 2018-08-21 ENCOUNTER — Other Ambulatory Visit: Payer: Self-pay

## 2018-08-21 ENCOUNTER — Ambulatory Visit: Payer: Self-pay | Admitting: Licensed Clinical Social Worker

## 2018-08-21 DIAGNOSIS — F410 Panic disorder [episodic paroxysmal anxiety] without agoraphobia: Secondary | ICD-10-CM

## 2018-08-21 DIAGNOSIS — F3162 Bipolar disorder, current episode mixed, moderate: Secondary | ICD-10-CM

## 2018-08-21 DIAGNOSIS — F411 Generalized anxiety disorder: Secondary | ICD-10-CM

## 2018-08-21 DIAGNOSIS — F333 Major depressive disorder, recurrent, severe with psychotic symptoms: Secondary | ICD-10-CM

## 2018-08-21 MED ORDER — DULOXETINE HCL 30 MG PO CPEP: 90.0000 mg | ORAL_CAPSULE | Freq: Every day | ORAL | 2 refills | Status: DC

## 2018-08-21 MED ORDER — LITHIUM CARBONATE ER 450 MG PO TBCR: 450.0000 mg | EXTENDED_RELEASE_TABLET | Freq: Three times a day (TID) | ORAL | 2 refills | Status: DC
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
## 2018-08-28 ENCOUNTER — Ambulatory Visit: Payer: Self-pay | Admitting: Licensed Clinical Social Worker

## 2018-08-28 ENCOUNTER — Other Ambulatory Visit: Payer: Self-pay

## 2018-08-28 DIAGNOSIS — F333 Major depressive disorder, recurrent, severe with psychotic symptoms: Secondary | ICD-10-CM

## 2018-08-28 DIAGNOSIS — F410 Panic disorder [episodic paroxysmal anxiety] without agoraphobia: Secondary | ICD-10-CM

## 2018-08-28 DIAGNOSIS — F411 Generalized anxiety disorder: Secondary | ICD-10-CM

## 2018-08-28 NOTE — BH Specialist Note (Signed)
Integrated Behavioral Health Follow Up Visit Via Phone  MRN: [id number] Name: Taylor Bryant  Type of Service: [place] Interpretor:No. Interpretor Name and Language: Not applicable.  SUBJECTIVE: Taylor Bryant is a 38 y.o. male accompanied by himself. Patient was referred by Staci Acosta NP for mental health. Patient reports the following symptoms/concerns: He reports that he was sent home from work yesterday due to nausea, fatigue, dizziness, and feeling faint. He denies having a fever. He explains that he thinks that part of his symptoms are due to an increase in his anxiety and panic attacks. He notes that due to wanting to mask his increasing symptoms of depression and anxiety from everyone around him that he has been taking 4 of the Lithium instead of 3 tablets of the 450 mg tablet daily. He notes that he has been feeling not happy at his job and wants to transfer to another [employer] if possible. He explains that he has been trying to focus on the positives in his life and not focus so much on the negative. He denies suicidal and homicidal thoughts.  Duration of problem: ; Severity of problem: moderate  OBJECTIVE: Mood: Euthymic and Affect: Appropriate Risk of harm to self or others: No plan to harm self or others  LIFE CONTEXT: Family and Social: See above. School/Work: See above. Self-Care: See above. Life Changes: See above.   GOALS ADDRESSED: Patient will: 1.  Reduce symptoms of: anxiety  2.  Increase knowledge and/or ability of: healthy habits and self-management skills  3.  Demonstrate ability to: Increase healthy adjustment to current life circumstances  INTERVENTIONS: Interventions utilized:  Brief CBT was utilized by the clinician focusing on the patient's anxiety and affect on normal cognition. Clinician processed with the patient regarding how he has been doing since the last follow up session. Clinician asked the patient if he has  experienced a fever in the last few days. Clinician explained to the patient that she understands that he has been having a tough week but advised him to only take three tablets of the Lithium 450 mg daily as prescribed because it could be contributing to his nausea, dizziness, feeling faint, and lack of energy. Clinician explained to the patient that its important to practice self care and focus on his health.  Standardized Assessments completed: GAD-7 and PHQ 9  ASSESSMENT: Patient currently experiencing see above.   Patient may benefit from see above.  PLAN: 1. Follow up with behavioral health clinician on : one week or earlier if needed. 2. Behavioral recommendations: see above.  3. Referral(s): [place] (In Clinic) 4. "From scale of 1-10, how likely are you to follow plan?":   Bayard Hugger, LCSW

## 2018-08-31 ENCOUNTER — Telehealth: Payer: Self-pay

## 2018-08-31 ENCOUNTER — Other Ambulatory Visit: Payer: Self-pay

## 2018-08-31 ENCOUNTER — Encounter (HOSPITAL_COMMUNITY): Payer: Self-pay

## 2018-08-31 ENCOUNTER — Ambulatory Visit (HOSPITAL_COMMUNITY)
Admission: EM | Admit: 2018-08-31 | Discharge: 2018-08-31 | Disposition: A | Discharge status: Home / Self Care | Payer: BC Managed Care – PPO | Attending: Family Medicine | Admitting: Family Medicine

## 2018-08-31 DIAGNOSIS — R030 Elevated blood-pressure reading, without diagnosis of hypertension: Secondary | ICD-10-CM

## 2018-08-31 DIAGNOSIS — Z8249 Family history of ischemic heart disease and other diseases of the circulatory system: Secondary | ICD-10-CM | POA: Insufficient documentation

## 2018-08-31 DIAGNOSIS — J029 Acute pharyngitis, unspecified: Secondary | ICD-10-CM

## 2018-08-31 DIAGNOSIS — R197 Diarrhea, unspecified: Secondary | ICD-10-CM | POA: Diagnosis not present

## 2018-08-31 DIAGNOSIS — Z87891 Personal history of nicotine dependence: Secondary | ICD-10-CM | POA: Insufficient documentation

## 2018-08-31 DIAGNOSIS — Z20822 Contact with and (suspected) exposure to covid-19: Secondary | ICD-10-CM

## 2018-08-31 DIAGNOSIS — E785 Hyperlipidemia, unspecified: Secondary | ICD-10-CM | POA: Diagnosis not present

## 2018-08-31 DIAGNOSIS — R0789 Other chest pain: Secondary | ICD-10-CM

## 2018-08-31 DIAGNOSIS — Z20828 Contact with and (suspected) exposure to other viral communicable diseases: Secondary | ICD-10-CM | POA: Insufficient documentation

## 2018-08-31 DIAGNOSIS — Z79899 Other long term (current) drug therapy: Secondary | ICD-10-CM | POA: Insufficient documentation

## 2018-08-31 DIAGNOSIS — R6889 Other general symptoms and signs: Secondary | ICD-10-CM

## 2018-08-31 DIAGNOSIS — R42 Dizziness and giddiness: Secondary | ICD-10-CM | POA: Diagnosis not present

## 2018-08-31 DIAGNOSIS — R51 Headache: Secondary | ICD-10-CM | POA: Diagnosis not present

## 2018-08-31 DIAGNOSIS — F319 Bipolar disorder, unspecified: Secondary | ICD-10-CM | POA: Insufficient documentation

## 2018-08-31 DIAGNOSIS — F411 Generalized anxiety disorder: Secondary | ICD-10-CM | POA: Diagnosis not present

## 2018-08-31 DIAGNOSIS — D680 Von Willebrand's disease: Secondary | ICD-10-CM | POA: Insufficient documentation

## 2018-08-31 DIAGNOSIS — R110 Nausea: Secondary | ICD-10-CM | POA: Diagnosis present

## 2018-08-31 DIAGNOSIS — R509 Fever, unspecified: Secondary | ICD-10-CM

## 2018-08-31 LAB — POCT RAPID STREP A: Streptococcus, Group A Screen (Direct): NEGATIVE

## 2018-08-31 MED ORDER — IBUPROFEN 600 MG PO TABS: 600.0000 mg | ORAL_TABLET | Freq: Three times a day (TID) | ORAL | 0 refills | Status: DC | PRN

## 2018-08-31 MED ORDER — ONDANSETRON 4 MG PO TBDP: 4.0000 mg | ORAL_TABLET | Freq: Three times a day (TID) | ORAL | 0 refills | Status: DC | PRN

## 2018-08-31 MED ORDER — BENZONATATE 200 MG PO CAPS: 200.0000 mg | ORAL_CAPSULE | Freq: Three times a day (TID) | ORAL | 0 refills | Status: AC | PRN

## 2018-08-31 NOTE — Discharge Instructions (Addendum)
Strep test negative COVID pending We will call if positive  Zofran as needed for nausea Tessalon every 8 hours for cough For congestino may use over the counter mucinex or zyrtec/cetirizine Use anti-inflammatories for pain/swelling. You may take up to 800 mg Ibuprofen every 8 hours with food. You may supplement Ibuprofen with Tylenol [phone] mg every 8 hours.   Rest and drink fluids  Continue to monitor blood pressure IF headache, dizziness and lightheadedness, chest discomfort worsening please follow up in emergency room

## 2018-08-31 NOTE — ED Triage Notes (Signed)
Pt states he has had nausea and he feels confused . Pt states that his b/p has been going up. X 6 days. Pt has been tested for Covid. 07/15/18 Pt was negative for Covid x 2 weeks ago.

## 2018-09-01 NOTE — ED Provider Notes (Signed)
[facility]    CSN: [id number] Arrival date & time: 08/31/18  1346      History   Chief Complaint Chief Complaint  Patient presents with  . Nausea    HPI Taylor Bryant is a 38 y.o. male history of von Willebrand's, schizoaffective disorder, bicuspid aortic valve presenting today for evaluation of nausea and dizziness.  Patient states that over the past week he has had persistent nausea.  Intermittent diarrhea.  He is also had episodes of dizziness and intermittent headaches.  He is also been concerned about his blood pressure.  Last night he noted his blood pressure to be ranging from 1 60-1 80 systolic and approximately 110 diastolic.  This morning blood pressure has been slightly improved.  He is not on blood pressure medicine.  He has also developed cough, congestion and URI symptoms over the past week.  He tested negative for COVID approximately 1 month ago.  Has had hot and cold chills.  Fever noted today in clinic.  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  Believes there may have been a coworker that he may have been exposed to.  He has had some intermittent left-sided chest discomfort.  Yesterday he had associated bilateral hand tingling.  This is improved today.  Notes he has a history of anxiety.  He does use tobacco.  HPI  Past Medical History:  Diagnosis Date  . Bicuspid aortic valve   . Heart murmur   . Hyperlipidemia   . Schizoaffective disorder (HCC)   . Von Willebrand disease [facility]HCC)     Patient Active Problem List   Diagnosis Date Noted  . Hyperlipidemia 07/10/2018  . Generalized anxiety disorder 07/10/2018  . Bipolar disorder, current episode mixed, moderate (HCC) 05/01/2018  . History of aortic valve disorder 12/10/2017  . Bicuspid aortic valve 12/10/2017  . Von Willebrand disease (HCC) 09/26/2017  . Asthma 09/17/2014    Past Surgical History:  Procedure Laterality Date  . TOOTH EXTRACTION         Home Medications     Prior to Admission medications   Medication Sig Start Date End Date Taking? Authorizing Provider  atorvastatin (LIPITOR) 40 MG tablet Take 1 tablet (40 mg total) by mouth daily at 6 PM. 06/05/18   Iloabachie, Chioma E, NP  benzonatate (TESSALON) 200 MG capsule Take 1 capsule (200 mg total) by mouth 3 (three) times daily as needed for up to 7 days for cough. 08/31/18 09/07/18  ,  C, PA-C  busPIRone (BUSPAR) 10 MG tablet Take 1 tablet (10 mg total) by mouth 3 (three) times daily for 30 days. 06/19/18 07/19/18  Doles-Johnson, Teah, NP  Cetirizine HCl 10 MG CAPS Take 1 capsule (10 mg total) by mouth daily for 10 days. 07/07/18 07/17/18  ,  C, PA-C  DULoxetine (CYMBALTA) 30 MG capsule Take 3 capsules (90 mg total) by mouth daily. 08/21/18 09/20/18  Doles-Johnson, Teah, NP  fluticasone (FLONASE) 50 MCG/ACT nasal spray Place 1-2 sprays into both nostrils daily. 07/07/18   ,  C, PA-C  ibuprofen (ADVIL) 600 MG tablet Take 1 tablet (600 mg total) by mouth every 8 (eight) hours as needed for fever. 08/31/18   ,  C, PA-C  lithium carbonate (ESKALITH) 450 MG CR tablet Take 1 tablet (450 mg total) by mouth 3 (three) times daily. 08/21/18   Doles-Johnson, Teah, NP  olopatadine (PATANOL) 0.1 % ophthalmic solution Place 1 drop into both eyes 2 (two) times daily. For eye  itching 07/07/18   ,  C, PA-C  ondansetron (ZOFRAN ODT) 4 MG disintegrating tablet Take 1 tablet (4 mg total) by mouth every 8 (eight) hours as needed for nausea or vomiting. 08/31/18   ,  C, PA-C  tadalafil (CIALIS) 5 MG tablet Take 1 tablet (5 mg total) by mouth daily as needed for up to 30 days for erectile dysfunction. 06/18/18 07/18/18  Jerilee FieldEskridge, Matthew, MD    Family History Family History  Problem Relation Age of Onset  . Heart Problems Father     Social History Social History   Tobacco Use  . Smoking status: Former Smoker    Packs/day: 0.25    Years: 16.00    Pack years:  4.00    Types: Cigarettes    Quit date: 2016    Years since quitting: 4.6  . Smokeless tobacco: Never Used  Substance Use Topics  . Alcohol use: Not Currently  . Drug use: No     Allergies   Penicillins   Review of Systems Review of Systems  Constitutional: Positive for chills, fatigue and fever. Negative for activity change and appetite change.  HENT: Positive for congestion, rhinorrhea and sore throat. Negative for ear pain, sinus pressure and trouble swallowing.   Eyes: Negative for photophobia, pain, discharge, redness and visual disturbance.  Respiratory: Positive for cough. Negative for chest tightness and shortness of breath.   Cardiovascular: Negative for chest pain.  Gastrointestinal: Positive for diarrhea and nausea. Negative for abdominal pain and vomiting.  Genitourinary: Negative for decreased urine volume and hematuria.  Musculoskeletal: Negative for myalgias, neck pain and neck stiffness.  Skin: Negative for rash.  Neurological: Positive for headaches. Negative for dizziness, syncope, facial asymmetry, speech difficulty, weakness, light-headedness and numbness.     Physical Exam Triage Vital Signs ED Triage Vitals  Enc Vitals Group     BP 08/31/18 1509 (!) 142/85     Pulse Rate 08/31/18 1509 76     Resp 08/31/18 1509 18     Temp 08/31/18 1509 (!) 100.5 F (38.1 C)     Temp Source 08/31/18 1509 Oral     SpO2 08/31/18 1509 97 %     Weight 08/31/18 1507 197 lb (89.4 kg)     Height --      Head Circumference --      Peak Flow --      Pain Score 08/31/18 1507 7     Pain Loc --      Pain Edu? --      Excl. in GC? --    No data found.  Updated Vital Signs BP (!) 142/85 (BP Location: Right Arm)   Pulse 76   Temp (!) 100.5 F (38.1 C) (Oral)   Resp 18   Wt 197 lb (89.4 kg)   SpO2 97%   BMI 27.87 kg/m   Visual Acuity Right Eye Distance:   Left Eye Distance:   Bilateral Distance:    Right Eye Near:   Left Eye Near:    Bilateral Near:      Physical Exam Vitals signs and nursing note reviewed.  Constitutional:      Appearance: He is well-developed.  HENT:     Head: Normocephalic and atraumatic.     Ears:     Comments: Bilateral ears without tenderness to palpation of external auricle, tragus and mastoid, EAC's without erythema or swelling, TM's with good bony landmarks and cone of light. Non erythematous.     Nose:  Comments: rhinnorhea present    Mouth/Throat:     Comments: Oral mucosa pink and moist, no tonsillar enlargement or exudate. Posterior pharynx patent and nonerythematous, no uvula deviation or swelling. Normal phonation. Palate elevates symmetrically Eyes:     Extraocular Movements: Extraocular movements intact.     Conjunctiva/sclera: Conjunctivae normal.     Pupils: Pupils are equal, round, and reactive to light.  Neck:     Musculoskeletal: Neck supple.  Cardiovascular:     Rate and Rhythm: Normal rate and regular rhythm.  Pulmonary:     Effort: Pulmonary effort is normal. No respiratory distress.     Breath sounds: Normal breath sounds.     Comments: Breathing comfortably at rest, CTABL, no wheezing, rales or other adventitious sounds auscultated  Left anterior chest tenderness Abdominal:     Palpations: Abdomen is soft.     Tenderness: There is no abdominal tenderness.  Musculoskeletal:     Comments: Moving extremities normally, gait without abnormality  Skin:    General: Skin is warm and dry.  Neurological:     General: No focal deficit present.     Mental Status: He is alert and oriented to person, place, and time. Mental status is at baseline.     Motor: No weakness.     Gait: Gait normal.      UC Treatments / Results  Labs (all labs ordered are listed, but only abnormal results are displayed) Labs Reviewed  CULTURE, GROUP A STREP ([place])  NOVEL CORONAVIRUS, NAA (HOSPITAL ORDER, SEND-OUT TO REF LAB)  POCT RAPID STREP A    EKG   Radiology No results found.  Procedures  Procedures (including critical care time)  Medications Ordered in UC Medications - No data to display  Initial Impression / Assessment and Plan / UC Course  I have reviewed the triage vital signs and the nursing notes.  Pertinent labs & imaging results that were available during my care of the patient were reviewed by me and considered in my medical decision making (see chart for details).     Blood pressure slightly elevated in clinic today.  No red flags and no neuro deficits on exam.  Does have fever and given in correlation with URI symptoms, GI upset and possible exposure, feel COVID high on differential as likely cause of symptoms.  EKG obtained in normal sinus rhythm.  Does have some slight elevation in V2 compared to previous EKG, but not present in contiguous leads.  Currently without chest pain.  Recommending to continue to monitor discomfort and chest and follow-up in emergency room if symptoms progressing or worsening.  Recommending symptomatic and supportive care, provided Zofran for nausea, Tessalon for cough.  Initially provided ibuprofen, but discussed with patient to not use this given his von Willebrand's disease.  Patient verbalized understanding and plans to not pick up at the pharmacy.Discussed strict return precautions. Patient verbalized understanding and is agreeable with plan.  Final Clinical Impressions(s) / UC Diagnoses   Final diagnoses:  Suspected Covid-19 Virus Infection     Discharge Instructions     Strep test negative COVID pending We will call if positive  Zofran as needed for nausea Tessalon every 8 hours for cough For congestino may use over the counter mucinex or zyrtec/cetirizine Use anti-inflammatories for pain/swelling. You may take up to 800 mg Ibuprofen every 8 hours with food. You may supplement Ibuprofen with Tylenol [phone] mg every 8 hours.   Rest and drink fluids  Continue to monitor blood pressure IF  headache, dizziness and  lightheadedness, chest discomfort worsening please follow up in emergency room    ED Prescriptions    Medication Sig Dispense Auth. Provider   benzonatate (TESSALON) 200 MG capsule Take 1 capsule (200 mg total) by mouth 3 (three) times daily as needed for up to 7 days for cough. 28 capsule ,  C, PA-C   ondansetron (ZOFRAN ODT) 4 MG disintegrating tablet Take 1 tablet (4 mg total) by mouth every 8 (eight) hours as needed for nausea or vomiting. 20 tablet ,  C, PA-C   ibuprofen (ADVIL) 600 MG tablet Take 1 tablet (600 mg total) by mouth every 8 (eight) hours as needed for fever. 30 tablet , [place] C, PA-C     Controlled Substance Prescriptions [place] Controlled Substance Registry consulted? Not Applicable   Lew Dawes,  C, [place] 09/01/18 1037

## 2018-09-02 ENCOUNTER — Telehealth: Payer: Self-pay | Admitting: Licensed Clinical Social Worker

## 2018-09-02 NOTE — Telephone Encounter (Signed)
Clinician had a case consultation with Dr. Octavia Heir, MD, psychiatric consultant regarding patient. Dr. Octavia Heir, MD explained that after reviewing his urgent care note on August 23rd that he noticed the the PA-C prescribed Iburofen to the patient and it can have a major drug interaction with Lithium. He advised clinician to contact the patient to stop taking the Ibuprofen immediately and explain the reason why.  Clinician contacted the patient to relay information to him regarding Lithium and Ibuprofen drug interaction. Patient stated he had not picked up the medication at the pharmacy yet and will not take the Ibuprofen. Patient was agreeable to the plans.

## 2018-09-03 LAB — CULTURE, GROUP A STREP (THRC)

## 2018-09-04 ENCOUNTER — Ambulatory Visit
Admission: EM | Admit: 2018-09-04 | Discharge: 2018-09-04 | Disposition: A | Discharge status: Other Acute Inpatient Hospital | Payer: BC Managed Care – PPO

## 2018-09-04 ENCOUNTER — Encounter: Payer: Self-pay | Admitting: Emergency Medicine

## 2018-09-04 ENCOUNTER — Ambulatory Visit: Payer: Self-pay | Admitting: Licensed Clinical Social Worker

## 2018-09-04 ENCOUNTER — Ambulatory Visit (INDEPENDENT_AMBULATORY_CARE_PROVIDER_SITE_OTHER)
Admission: RE | Admit: 2018-09-04 | Discharge: 2018-09-04 | Disposition: A | Discharge status: Home / Self Care | Payer: BC Managed Care – PPO | Source: Ambulatory Visit

## 2018-09-04 ENCOUNTER — Other Ambulatory Visit: Payer: Self-pay

## 2018-09-04 DIAGNOSIS — R531 Weakness: Secondary | ICD-10-CM

## 2018-09-04 DIAGNOSIS — R51 Headache: Secondary | ICD-10-CM

## 2018-09-04 DIAGNOSIS — F333 Major depressive disorder, recurrent, severe with psychotic symptoms: Secondary | ICD-10-CM

## 2018-09-04 DIAGNOSIS — R519 Headache, unspecified: Secondary | ICD-10-CM

## 2018-09-04 DIAGNOSIS — F411 Generalized anxiety disorder: Secondary | ICD-10-CM

## 2018-09-04 DIAGNOSIS — F410 Panic disorder [episodic paroxysmal anxiety] without agoraphobia: Secondary | ICD-10-CM

## 2018-09-04 DIAGNOSIS — R030 Elevated blood-pressure reading, without diagnosis of hypertension: Secondary | ICD-10-CM | POA: Diagnosis not present

## 2018-09-04 DIAGNOSIS — R42 Dizziness and giddiness: Secondary | ICD-10-CM | POA: Diagnosis not present

## 2018-09-04 LAB — NOVEL CORONAVIRUS, NAA (HOSP ORDER, SEND-OUT TO REF LAB; TAT 18-24 HRS): SARS-CoV-2, NAA: NOT DETECTED

## 2018-09-04 NOTE — BH Specialist Note (Signed)
Integrated Behavioral Health Follow Up Visit Via Phone  MRN: [id number] Name: Taylor Bryant  Type of Service: [place] Interpretor:No. Interpretor Name and Language: Not applicable.  SUBJECTIVE: Taylor Bryant is a 38 y.o. male accompanied by himself. Patient was referred by Staci Acosta NP at [facility] for mental health. Patient reports the following symptoms/concerns: He explains that he has been having a rough week due to having to quarantine himself until he gets his COVID 19 results back. He notes that he contacted his pharmacy to get his medications and was given a hard time. He reports that the pharmacist told him that she could not give him his Lithium until the prescribing doctor was contacted at the clinic. He describes being concerned about lower back pain, coughing to the point his ribs hurt, and itching all over. He notes that he tried to have his blood work completed last week but they were not able to draw his blood this past Thursday August 20th. He notes that he is worried about the pending COVID 19 results, his job, and his medicines. He explains that he is trying to find things to do to distract himself. He denies suicidal and homicidal thoughts.  Duration of problem: ; Severity of problem: moderate  OBJECTIVE: Mood: Euthymic and Affect: Appropriate Risk of harm to self or others: No plan to harm self or others  LIFE CONTEXT: Family and Social: See above. School/Work: See above. Self-Care: See above. Life Changes: See above.  GOALS ADDRESSED: Patient will: 1.  Reduce symptoms of: anxiety  2.  Increase knowledge and/or ability of: coping skills, healthy habits, self-management skills and stress reduction  3.  Demonstrate ability to: Increase healthy adjustment to current life circumstances  INTERVENTIONS: Interventions utilized:  Supportive Counseling was utilized by the clinician focusing on the patient's stress. Clinician  processed with the patient regarding how he has been doing since the last follow up session. Clinician utilized reflective listening encouraging the patient to ventilate his feelings towards his current situation. Clinician explained to the patient that it sounds like he has had a rough week with becoming sick and having to take a COVID 19 test. Clinician explained to the patient that she thinks its a good idea that he have some distractions because otherwise he will obsess over whether or not he has the virus, his blood pressure, and issues that he is unable to solve at this point in time. Clinician encouraged the patient to practice self care. Clinician explained that she would have the [place] contact the pharmacy to inquire about his Lithium and what we need to do on our end so he can get the medication.  Standardized Assessments completed: To be conducted at the next follow up session.  ASSESSMENT: Patient currently experiencing see above.   Patient may benefit from see above.  PLAN: 1. Follow up with behavioral health clinician on : one week or earlier if needed. 2. Behavioral recommendations: see above. 3. Referral(s): [place] (In Clinic) 4. "From scale of 1-10, how likely are you to follow plan?":   Bayard Hugger, LCSW

## 2018-09-04 NOTE — ED Triage Notes (Signed)
Pt c/o headache and high blood pressure. He has been checking his BP at home and running 158-182/94-112. Started about a week ago. He states that he also is dizzy. He was seen at the Urgent care on 08/31/18 for COVID symptoms. Denies chest pain or SOB.

## 2018-09-04 NOTE — ED Provider Notes (Signed)
[facility] ____________________________________________  Time seen: Approximately 2:55 PM  I have reviewed the triage vital signs and the nursing notes.   HISTORY  Chief Complaint Headache and Hypertension   HPI Taylor Bryant is a 38 y.o. male past medical history of von Willebrand, bipolar and schizoaffective disorder presenting for evaluation of headaches.  Patient reports he has a history of headaches that is been ongoing for greater than 6 months worse in the morning.  However reports in the last week he has had "severe "headaches worse first thing in the morning and eventually get somewhat better throughout the day after taking Tylenol.  Patient also reports in the last week he has been having increasing bilateral hand tingling and numbness, intermittent dizziness and weakness.  Patient also reports he went to be seen on Sunday and it was noted that he had a fever and was tested for COVID.  States COVID-19 testing resulted negative today.  Has had intermittent cough in which he thinks is due to his anxiety.  Also complains of bleeding to his face more than normal from shaving.  States headache currently is diffuse and moderate.  Denies vision loss, chest pain, shortness of breath, sore throat, changes in taste or smell, vomiting.  Patient also states a few weeks ago he went to have his lithium levels evaluated but they were unable to draw the blood and his lithium levels have not been rechecked.  Patient reports that he has been checking his blood pressure at home and he has been having elevated readings between 150-180 systolically and 100-120 diastolically.  Reports he was on hypertension medication years ago but no longer.  NO PCP.    Past Medical History:  Diagnosis Date  . Bicuspid aortic valve   . Heart murmur   . Hyperlipidemia   . Schizoaffective disorder (HCC)   . Von Willebrand disease [facility])     Patient Active Problem List   Diagnosis Date Noted  .  Hyperlipidemia 07/10/2018  . Generalized anxiety disorder 07/10/2018  . Bipolar disorder, current episode mixed, moderate (HCC) 05/01/2018  . History of aortic valve disorder 12/10/2017  . Bicuspid aortic valve 12/10/2017  . Von Willebrand disease (HCC) 09/26/2017  . Asthma 09/17/2014    Past Surgical History:  Procedure Laterality Date  . TOOTH EXTRACTION       No current facility-administered medications for this encounter.   Current Outpatient Medications:  .  atorvastatin (LIPITOR) 40 MG tablet, Take 1 tablet (40 mg total) by mouth daily at 6 PM., Disp: 30 tablet, Rfl: 2 .  DULoxetine (CYMBALTA) 30 MG capsule, Take 3 capsules (90 mg total) by mouth daily., Disp: 90 capsule, Rfl: 2 .  lithium carbonate (ESKALITH) 450 MG CR tablet, Take 1 tablet (450 mg total) by mouth 3 (three) times daily., Disp: 90 tablet, Rfl: 2 .  benzonatate (TESSALON) 200 MG capsule, Take 1 capsule (200 mg total) by mouth 3 (three) times daily as needed for up to 7 days for cough., Disp: 28 capsule, Rfl: 0 .  busPIRone (BUSPAR) 10 MG tablet, Take 1 tablet (10 mg total) by mouth 3 (three) times daily for 30 days., Disp: 90 tablet, Rfl: 2 .  Cetirizine HCl 10 MG CAPS, Take 1 capsule (10 mg total) by mouth daily for 10 days., Disp: 10 capsule, Rfl: 0 .  fluticasone (FLONASE) 50 MCG/ACT nasal spray, Place 1-2 sprays into both nostrils daily., Disp: 1 g, Rfl: 2 .  ibuprofen (ADVIL) 600 MG tablet, Take 1 tablet (600  mg total) by mouth every 8 (eight) hours as needed for fever., Disp: 30 tablet, Rfl: 0 .  olopatadine (PATANOL) 0.1 % ophthalmic solution, Place 1 drop into both eyes 2 (two) times daily. For eye itching, Disp: 5 mL, Rfl: 12 .  tadalafil (CIALIS) 5 MG tablet, Take 1 tablet (5 mg total) by mouth daily as needed for up to 30 days for erectile dysfunction., Disp: 30 tablet, Rfl: 5  Allergies Penicillins  Family History  Problem Relation Age of Onset  . Heart Problems Father   Mother: Stroke  Grandfather: Aneurysm, von Willebrand Uncle: Aneurysm  Social History Social History   Tobacco Use  . Smoking status: Former Smoker    Packs/day: 0.25    Years: 16.00    Pack years: 4.00    Types: Cigarettes    Quit date: 2016    Years since quitting: 4.6  . Smokeless tobacco: Never Used  Substance Use Topics  . Alcohol use: Not Currently  . Drug use: Yes    Types: Marijuana    Review of Systems Constitutional: Reports he had a fever Sunday Monday and Tuesday, T-max 100.5. Eyes: No visual changes. ENT: No sore throat. Cardiovascular: Denies chest pain. Respiratory: Denies shortness of breath.  Positive cough. Gastrointestinal: No abdominal pain.  No nausea, no vomiting.  Positive diarrhea. Genitourinary: Negative for dysuria. Musculoskeletal: Positive generalized fatigue. Skin: Negative for rash. Neurological: positive headache.     ____________________________________________   PHYSICAL EXAM:  VITAL SIGNS: ED Triage Vitals  Enc Vitals Group     BP 09/04/18 1423 (!) 149/81     Pulse Rate 09/04/18 1423 74     Resp 09/04/18 1423 18     Temp 09/04/18 1423 97.9 F (36.6 C)     Temp Source 09/04/18 1423 Oral     SpO2 09/04/18 1423 99 %     Weight 09/04/18 1419 197 lb (89.4 kg)     Height 09/04/18 1419 5\' 11"  (1.803 m)     Head Circumference --      Peak Flow --      Pain Score 09/04/18 1419 7     Pain Loc --      Pain Edu? --      Excl. in GC? --     Constitutional: Alert and oriented. Well appearing and in no acute distress. Eyes: Conjunctivae are normal. . ENT      Head: Normocephalic and atraumatic. Cardiovascular: Normal rate, regular rhythm. Grossly normal heart sounds.  Good peripheral circulation. Respiratory: Normal respiratory effort without tachypnea nor retractions. Breath sounds are clear and equal bilaterally. No wheezes, rales, rhonchi.. Musculoskeletal: Steady gait.  No extremity edema. Neurologic:  Normal speech and language. No gross  focal neurologic deficits are appreciated. Speech is normal. No gait instability.  Skin:  Skin is warm, dry and intact. No rash noted. Psychiatric: Mood and affect are normal. Speech and behavior are normal. Patient exhibits appropriate insight and judgment   ___________________________________________   LABS (all labs ordered are listed, but only abnormal results are displayed)  Labs Reviewed - No data to display   PROCEDURES Procedures    INITIAL IMPRESSION / ASSESSMENT AND PLAN / ED COURSE  Pertinent labs & imaging results that were available during my care of the patient were reviewed by me and considered in my medical decision making (see chart for details).  Alert and oriented patient.  Discussed multiple differentials with patient including COVID-19, anxiety related, lithium toxicity, aneurysm, migraine, hypertension.  Recommend for further  evaluation emergency room at this time.  Patient states that he will be going directly to [place] [facility].  Also strongly encouraged to establish primary care for regular follow-up.  ____________________________________________   FINAL CLINICAL IMPRESSION(S) / ED DIAGNOSES  Final diagnoses:  Intractable headache, unspecified chronicity pattern, unspecified headache type  Weakness     ED Discharge Orders    None       Note: This dictation was prepared with Dragon dictation along with smaller phrase technology. Any transcriptional errors that result from this process are unintentional.         Marylene Land, NP 09/04/18 1521

## 2018-09-04 NOTE — Discharge Instructions (Addendum)
Go directly to emergency room as discussed.  °

## 2018-09-04 NOTE — Discharge Instructions (Addendum)
Recommending further evaluation and management in person given hx of elevated blood pressure, headaches, dizziness, weakness, and tingling in bilateral fingers.  Patient aware and in agreement with this plan.  Will go by private vehicle to [facility].

## 2018-09-04 NOTE — ED Provider Notes (Signed)
[place] Virtual Visit via Video Note:  Taylor Bryant  initiated request for Telemedicine visit with [facility] team. I connected with Taylor Bryant  on 09/04/2018 at 1:53 PM  for a synchronized telemedicine visit using a video enabled HIPPA compliant telemedicine application. I verified that I am speaking with Taylor Bryant  using two identifiers. Taylor Box, PA-C  was physically located in a [facility] site and Taylor Bryant was located at a different location.   The limitations of evaluation and management by telemedicine as well as the availability of in-person appointments were discussed. Patient was informed that he  may incur a bill ( including co-pay) for this virtual visit encounter. Taylor Bryant  expressed understanding and gave verbal consent to proceed with virtual visit.   [id number] 09/04/18 Arrival Time: 1330  CC: HA; elevated blood pressure  SUBJECTIVE: History from: patient.  Taylor Bryant is a 38 y.o. male who presents with intermittent headaches for the past couple of months and elevated blood pressure for the past few days.  Localizes HA to frontal aspect.  Worse in the mornings.  States blood pressure today at home was 174/108.  Rechecked and it was 152/103.  Was seen on 08/31/18 and was 142/85 in office.  Reports hx of HTN in the past and was on BP medications, unsure of which one.  Currently not taking blood pressure medication.  Has been seen for this by PCP and stated it may have been related to anxiety.  Patient also mentions multiple family members on his father's side that have died from aneurysms.   Patient also mentions rib and back pain related to coughing.  Reports fever 3 days ago of 100.5, tingling in bilateral hands, dizziness, weakness, and 2 episodes of loose stools daily for the past week.  Was seen on 08/31/18 and had COVID testing that was negative.  Works at [employer] as a Engineer, water.  Denies chest pain, SOB, vision changes, abdominal pain,  changes in urinary habits.    ROS: As per HPI.  All other pertinent ROS negative.     Past Medical History:  Diagnosis Date  . Bicuspid aortic valve   . Heart murmur   . Hyperlipidemia   . Schizoaffective disorder ([place])   . Von Willebrand disease ([place])    Past Surgical History:  Procedure Laterality Date  . TOOTH EXTRACTION     Allergies  Allergen Reactions  . Penicillins Rash   No current facility-administered medications on file prior to encounter.    Current Outpatient Medications on File Prior to Encounter  Medication Sig Dispense Refill  . atorvastatin (LIPITOR) 40 MG tablet Take 1 tablet (40 mg total) by mouth daily at 6 PM. 30 tablet 2  . benzonatate (TESSALON) 200 MG capsule Take 1 capsule (200 mg total) by mouth 3 (three) times daily as needed for up to 7 days for cough. 28 capsule 0  . busPIRone (BUSPAR) 10 MG tablet Take 1 tablet (10 mg total) by mouth 3 (three) times daily for 30 days. 90 tablet 2  . Cetirizine HCl 10 MG CAPS Take 1 capsule (10 mg total) by mouth daily for 10 days. 10 capsule 0  . DULoxetine (CYMBALTA) 30 MG capsule Take 3 capsules (90 mg total) by mouth daily. 90 capsule 2  . fluticasone (FLONASE) 50 MCG/ACT nasal spray Place 1-2 sprays into both nostrils daily. 1 g 2  . ibuprofen (ADVIL) 600 MG tablet Take 1 tablet (600 mg total) by mouth  every 8 (eight) hours as needed for fever. 30 tablet 0  . lithium carbonate (ESKALITH) 450 MG CR tablet Take 1 tablet (450 mg total) by mouth 3 (three) times daily. 90 tablet 2  . olopatadine (PATANOL) 0.1 % ophthalmic solution Place 1 drop into both eyes 2 (two) times daily. For eye itching 5 mL 12  . tadalafil (CIALIS) 5 MG tablet Take 1 tablet (5 mg total) by mouth daily as needed for up to 30 days for erectile dysfunction. 30 tablet 5   OBJECTIVE:   There were no vitals filed for this visit.  General appearance: alert; no distress Eyes: EOMI grossly HENT: normocephalic; atraumatic Neck: supple with FROM  Lungs: normal respiratory effort; speaking in full sentences without difficulty Extremities: moves extremities without difficulty Skin: No obvious rashes Neurologic: No facial asymmetries Psychological: alert and cooperative; anxious mood and affect  ASSESSMENT & PLAN:  1. Acute intractable headache, unspecified headache type   2. Elevated blood pressure reading   3. Dizziness     No orders of the defined types were placed in this encounter.  Recommending further evaluation and management in person given hx of elevated blood pressure, headaches, dizziness, weakness, and tingling in bilateral fingers.  Patient aware and in agreement with this plan.  [facility]    I provided 15 minutes of non-face-to-face time during this encounter.  BeaumontBrittany Favour A[place], PA-C  09/04/2018 1:53 PM          Alvino ChapelWurst, [place]ada[place]itt[place], PA-C 09/04/18 1425

## 2018-09-05 ENCOUNTER — Encounter (HOSPITAL_COMMUNITY): Payer: Self-pay

## 2018-09-11 ENCOUNTER — Other Ambulatory Visit: Payer: Self-pay | Admitting: Gerontology

## 2018-09-11 ENCOUNTER — Other Ambulatory Visit: Payer: Self-pay

## 2018-09-11 ENCOUNTER — Encounter: Payer: Self-pay | Admitting: Licensed Clinical Social Worker

## 2018-09-11 ENCOUNTER — Ambulatory Visit: Payer: Self-pay

## 2018-09-11 ENCOUNTER — Ambulatory Visit: Payer: Self-pay | Admitting: Licensed Clinical Social Worker

## 2018-09-11 DIAGNOSIS — F3162 Bipolar disorder, current episode mixed, moderate: Secondary | ICD-10-CM

## 2018-09-11 DIAGNOSIS — F411 Generalized anxiety disorder: Secondary | ICD-10-CM

## 2018-09-11 DIAGNOSIS — Z0000 Encounter for general adult medical examination without abnormal findings: Secondary | ICD-10-CM

## 2018-09-11 DIAGNOSIS — F333 Major depressive disorder, recurrent, severe with psychotic symptoms: Secondary | ICD-10-CM

## 2018-09-11 DIAGNOSIS — F410 Panic disorder [episodic paroxysmal anxiety] without agoraphobia: Secondary | ICD-10-CM

## 2018-09-11 NOTE — Progress Notes (Unsigned)
c-met  

## 2018-09-11 NOTE — BH Specialist Note (Signed)
Integrated Behavioral Health Follow Up Visit Via Phone  MRN: [id number] Name: Taylor Bryant  Type of Service: [place] Interpretor:No. Interpretor Name and Language: Not applicable.  SUBJECTIVE: Taylor Bryant is a 38 y.o. male accompanied by himself.  Patient was referred by Staci Acosta NP for mental health. Patient reports the following symptoms/concerns: He reports that he has not been having the best week. He explains that his anxiety has been high in the last week. He explains that his anxiety will worsen every time he ends up going outside because he has had to be tested twice for COVID 19. He describes having panic attacks just about every hour and sometimes after he has slept that he will wake up to a panic attack. He notes that he wants to transfer to another [employer] because his former boss has left and does not think he can stand working in the environment without her. He denies suicidal and homicidal thoughts.  Duration of problem: ; Severity of problem: moderate  OBJECTIVE: Mood: Euthymic and Affect: Appropriate Risk of harm to self or others: No plan to harm self or others  LIFE CONTEXT: Family and Social: see above. School/Work: see above. Self-Care: see above. Life Changes: see above.   GOALS ADDRESSED: Patient will: 1.  Reduce symptoms of: anxiety  2.  Increase knowledge and/or ability of: coping skills, self-management skills and stress reduction  3.  Demonstrate ability to: Increase healthy adjustment to current life circumstances  INTERVENTIONS: Interventions utilized:  Brief CBT was utilized by the clinician during today's follow up session focusing on the patient's anxiety and coping skills. Clinician processed with the patient regarding how she has been doing since the last follow up session. Clinician discussed with the patient regarding his current stressors that are contributing to the panic attacks and anxiety. Clinician  suggested that the patient try a two different apps to assist with his anxiety and panic attacks: free sleep stories & Stress Less guided meditation. Clinician explained to the patient that he can listen to a guided meditation through out the day or pick from a variety of stress less cards that can suggest different relaxation techniques to try when he is feeling anxious. Clinician explained to the patient that he can listen to the sleep stories app at bed time to assist him with falling asleep and listen to again if he wakes up in the middle of the night and has problems falling back asleep.  Standardized Assessments completed: GAD-7 and PHQ 9  ASSESSMENT: Patient currently experiencing see above.   Patient may benefit from see above.  PLAN: 1. Follow up with behavioral health clinician on : one week or earlier if needed.  2. Behavioral recommendations: see above.  3. Referral(s): [place] (In Clinic) 4. "From scale of 1-10, how likely are you to follow plan?":   Bayard Hugger, LCSW

## 2018-09-16 ENCOUNTER — Other Ambulatory Visit: Payer: Self-pay

## 2018-09-16 ENCOUNTER — Other Ambulatory Visit: Payer: Self-pay | Admitting: Gerontology

## 2018-09-16 DIAGNOSIS — Z0000 Encounter for general adult medical examination without abnormal findings: Secondary | ICD-10-CM

## 2018-09-16 DIAGNOSIS — F3162 Bipolar disorder, current episode mixed, moderate: Secondary | ICD-10-CM

## 2018-09-16 MED ORDER — BUSPIRONE HCL 15 MG PO TABS: 15.0000 mg | ORAL_TABLET | Freq: Three times a day (TID) | ORAL | 0 refills | Status: DC

## 2018-09-18 ENCOUNTER — Other Ambulatory Visit: Payer: Self-pay

## 2018-09-18 ENCOUNTER — Ambulatory Visit: Payer: Self-pay | Admitting: Licensed Clinical Social Worker

## 2018-09-18 ENCOUNTER — Ambulatory Visit: Payer: Self-pay

## 2018-09-18 DIAGNOSIS — F411 Generalized anxiety disorder: Secondary | ICD-10-CM

## 2018-09-18 DIAGNOSIS — F3162 Bipolar disorder, current episode mixed, moderate: Secondary | ICD-10-CM

## 2018-09-18 DIAGNOSIS — F410 Panic disorder [episodic paroxysmal anxiety] without agoraphobia: Secondary | ICD-10-CM

## 2018-09-18 DIAGNOSIS — Z0000 Encounter for general adult medical examination without abnormal findings: Secondary | ICD-10-CM

## 2018-09-18 NOTE — BH Specialist Note (Signed)
Integrated Behavioral Health Follow Up Visit Via Phone.  MRN: [id number] Name: Taylor Bryant  Type of Service: [place] Interpretor:No. Interpretor Name and Language: Not applicable.  SUBJECTIVE: Taylor Bryant is a 38 y.o. male accompanied by himself. Patient was referred by Staci Acosta NP for mental health. Patient reports the following symptoms/concerns: He reports that he was able to pull things together towards the end of last week and make it into work. He explains that everything was going well until he had a conversation with his manager, co workers were making racist comments about Hispanic population, and had a panic attack on Sunday. He explains that he is having anxiety about having to do his labs over again this evening. He explains that overall he is doing better and looks forward to going to work again. He explains that he is no longer looking up his symptoms on web md. He denies suicidal and homicidal thoughts.  Duration of problem: ; Severity of problem: moderate  OBJECTIVE: Mood: Euthymic and Affect: Appropriate Risk of harm to self or others: No plan to harm self or others  LIFE CONTEXT: Family and Social: see above. School/Work: see above. Self-Care: see above. Life Changes: see above.   GOALS ADDRESSED: Patient will: 1.  Reduce symptoms of: anxiety  2.  Increase knowledge and/or ability of: coping skills  3.  Demonstrate ability to: Increase healthy adjustment to current life circumstances  INTERVENTIONS: Interventions utilized:  Brief CBT was utilized by the clinician focusing on the patient's anxiety. Clinician processed with the patient regarding how he has been doing since the last follow up session. Clinician explained to the patient that the reason he is having to have his labs redone is because for some reason the labs were not picked up by [facility] the same day. Clinician apologized to the patient for the inconveince  regarding having to re do his labs again. Clinician explained to the patient that usually staff from [facility] are supposed to stop by around 8 pm to pick up the labs but it did not happen last week. Clinician encouraged the patient to continue to utilize his coping skills and not obsess about things that are out of his control.  Standardized Assessments completed: next session.  ASSESSMENT: Patient currently experiencing see above.   Patient may benefit from see above.  PLAN: 1. Follow up with behavioral health clinician on : one week or earlier if needed. 2. Behavioral recommendations: see above.  3. Referral(s): [place] (In Clinic) 4. "From scale of 1-10, how likely are you to follow plan?":   Bayard Hugger, LCSW

## 2018-09-19 LAB — COMPREHENSIVE METABOLIC PANEL
ALT: 18 IU/L (ref 0–44)
AST: 15 IU/L (ref 0–40)
Albumin/Globulin Ratio: 1.6 (ref 1.2–2.2)
Albumin: 4.2 g/dL (ref 4.0–5.0)
Alkaline Phosphatase: 76 IU/L (ref 39–117)
BUN/Creatinine Ratio: 12 (ref 9–20)
BUN: 11 mg/dL (ref 6–24)
Bilirubin Total: 0.3 mg/dL (ref 0.0–1.2)
CO2: 23 mmol/L (ref 20–29)
Calcium: 8.5 mg/dL — ABNORMAL LOW (ref 8.7–10.2)
Chloride: 105 mmol/L (ref 96–106)
Creatinine, Ser: 0.93 mg/dL (ref 0.76–1.27)
GFR calc Af Amer: 118 mL/min/{1.73_m2} (ref >59)
GFR calc non Af Amer: 102 mL/min/{1.73_m2} (ref >59)
Globulin, Total: 2.7 g/dL (ref 1.5–4.5)
Glucose: 98 mg/dL (ref 65–99)
Potassium: 4.4 mmol/L (ref 3.5–5.2)
Sodium: 140 mmol/L (ref 134–144)
Total Protein: 6.9 g/dL (ref 6.0–8.5)

## 2018-09-19 LAB — TSH: TSH: 3.01 u[IU]/mL (ref 0.450–4.500)

## 2018-09-19 LAB — LITHIUM LEVEL: Lithium Lvl: 0.5 mmol/L — ABNORMAL LOW (ref 0.6–1.2)

## 2018-09-25 ENCOUNTER — Other Ambulatory Visit: Payer: Self-pay

## 2018-09-25 ENCOUNTER — Ambulatory Visit: Payer: Self-pay | Admitting: Gerontology

## 2018-09-25 ENCOUNTER — Ambulatory Visit: Payer: Self-pay | Admitting: Licensed Clinical Social Worker

## 2018-09-25 DIAGNOSIS — F410 Panic disorder [episodic paroxysmal anxiety] without agoraphobia: Secondary | ICD-10-CM

## 2018-09-25 DIAGNOSIS — R519 Headache, unspecified: Secondary | ICD-10-CM

## 2018-09-25 DIAGNOSIS — R5383 Other fatigue: Secondary | ICD-10-CM | POA: Insufficient documentation

## 2018-09-25 DIAGNOSIS — F411 Generalized anxiety disorder: Secondary | ICD-10-CM

## 2018-09-25 DIAGNOSIS — F3162 Bipolar disorder, current episode mixed, moderate: Secondary | ICD-10-CM

## 2018-09-25 NOTE — BH Specialist Note (Signed)
Integrated Behavioral Health Follow Up Visit Via Phone  MRN: [id number] Name: Taylor Bryant  Type of Service: [place] Interpretor:No. Interpretor Name and Language: not applicable.  SUBJECTIVE: Taylor Bryant is a 38 y.o. male accompanied by himself. Patient was referred by Staci Acosta NP for mental health. Patient reports the following symptoms/concerns: He reports that he has been doing a little bit better this week compared to last week. He notes that while at work, he noticed that one of the customers had a dog locked up the car with the windows up and he approached the person about to express concern about the heat. He explains that the customer said some racist things in Spanish that bothered him and he walked away. 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 He describes having increasing anxiety and intrusive thoughts at night. He notes that he is glad that he has today, tomorrow, and Saturday off. He denies suicidal and homicidal thoughts.  Duration of problem: ; Severity of problem: moderate  OBJECTIVE: Mood: Euthymic and Affect: Appropriate Risk of harm to self or others: No plan to harm self or others  LIFE CONTEXT: Family and Social: See above. School/Work: See above. Self-Care: See above.  Life Changes: See above.  GOALS ADDRESSED: Patient will: 1.  Reduce symptoms of: anxiety  2.  Increase knowledge and/or ability of: coping skills  3.  Demonstrate ability to: Increase healthy adjustment to current life circumstances  INTERVENTIONS: Interventions utilized:  Brief CBT was utilized by the clinician focusing on the patient's anxiety and affect on normal cognition. Clinician processed with the patient regarding how he has been doing since the last follow up session. Clinician explained to the patient that she and Dr. Octavia Heir, MD, psychiatric consultant reviewed his labs  and his lithium level is @ 0.5, which is good. Clinician explained to the patient that Dr. Octavia Heir, MD also stated that the low level abnormality with the his calcium is part of the reason for his increasing anxiety and suggested he follow up with a provider in clinic about it. Clinician encouraged the patient to continue to utilize his coping skills and practice self care.  Standardized Assessments completed: GAD-7 and PHQ 9  ASSESSMENT: Patient currently experiencing see above.   Patient may benefit from see above.  PLAN: 1. Follow up with behavioral health clinician on : one week or earlier if needed.  2. Behavioral recommendations: Labs reviewed with patient in regards to Lithium level @ 0.5. Discussion with patient regarding needing to see a provider to monitor calcium parathyroid abnormality per Dr. Octavia Heir, MD psychiatric consultant's recommendation.  3. Referral(s): [place] (In Clinic) 4. "From scale of 1-10, how likely are you to follow plan?":   Taylor Hugger, LCSW
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
## 2018-10-02 ENCOUNTER — Ambulatory Visit: Payer: Self-pay | Admitting: Licensed Clinical Social Worker

## 2018-10-02 ENCOUNTER — Other Ambulatory Visit: Payer: Self-pay

## 2018-10-02 DIAGNOSIS — F410 Panic disorder [episodic paroxysmal anxiety] without agoraphobia: Secondary | ICD-10-CM

## 2018-10-02 DIAGNOSIS — F411 Generalized anxiety disorder: Secondary | ICD-10-CM

## 2018-10-02 DIAGNOSIS — F3162 Bipolar disorder, current episode mixed, moderate: Secondary | ICD-10-CM

## 2018-10-02 NOTE — BH Specialist Note (Signed)
Integrated Behavioral Health Follow Up Visit Via Phone  MRN: [id number] Name: Taylor Bryant  Type of Service: [place] Interpretor:No. Interpretor Name and Language: Not applicable.  SUBJECTIVE: Taylor Bryant is a 38 y.o. male accompanied by himself. Patient was referred by Staci Acosta NP for mental health. Patient reports the following symptoms/concerns: He reports that things are going very well at work. He explains that Taylor Shadow, NP told him the previous week that she believes that the headaches coincided around the same time as starting the Buspar. He notes that he feels like the Buspar is working and wants to keep the dosage at 15 mg three times a day. He notes that he is not wanting to try anti psychotic medication due to the extreme side effects that he has experienced in the past. He explains that he believes that he has sleep apnea due to a history of snoring. He explains that he had a panic attack while driving and it scared him. He explains that he is feeling anxious and stressed that his ex wife keeps asking him for money that is not for his daughter. He notes that his lawyer spoke to child support and he is no longer going to have to pay child support anymore. He notes that he is not looking forward to the anniversary of his mom's death on 11/10/22. He denies suicidal and homicidal thoughts.  Duration of problem: ; Severity of problem: moderate  OBJECTIVE: Mood: Euthymic and Affect: Appropriate Risk of harm to self or others: No plan to harm self or others  LIFE CONTEXT: Family and Social: see above. School/Work: see above. Self-Care:see above. Life Changes: see above.  GOALS ADDRESSED: Patient will: 1.  Reduce symptoms of: anxiety  2.  Increase knowledge and/or ability of: coping skills and stress reduction  3.  Demonstrate ability to: Increase healthy adjustment to current life circumstances  INTERVENTIONS: Interventions  utilized:  Brief CBT was utilized by the clinician discussing the patient's anxiety and affect on behavior. Clinician relayed her case consultation with Dr. Octavia Heir, MD to the patient explaining that in terms of his anxiety that they have the option to reduce the dosage of Buspar to 10 mg three times a day to see if his headaches subside. Clinician explained to the patient that Dr. Octavia Heir, MD, psychiatric consultant suggested the option of trying a antipsychotic medication and wants to know if he has been tried on any in the past. Clinician encouraged the patient to continue to utilize his coping skills and set healthy boundaries with others.  Standardized Assessments completed: next session  ASSESSMENT: Patient currently experiencing see above.   Patient may benefit from see above.  PLAN: 1. Follow up with behavioral health clinician on : one week or earlier if needed. 2. Behavioral recommendations: see above. 3. Referral(s): [place] (In Clinic) 4. "From scale of 1-10, how likely are you to follow plan?":   Bayard Hugger, LCSW

## 2018-10-08 ENCOUNTER — Other Ambulatory Visit: Payer: Self-pay

## 2018-10-08 DIAGNOSIS — R5383 Other fatigue: Secondary | ICD-10-CM

## 2018-10-09 ENCOUNTER — Ambulatory Visit: Payer: Self-pay | Admitting: Licensed Clinical Social Worker

## 2018-10-09 DIAGNOSIS — F3162 Bipolar disorder, current episode mixed, moderate: Secondary | ICD-10-CM

## 2018-10-09 DIAGNOSIS — F411 Generalized anxiety disorder: Secondary | ICD-10-CM

## 2018-10-09 DIAGNOSIS — F410 Panic disorder [episodic paroxysmal anxiety] without agoraphobia: Secondary | ICD-10-CM

## 2018-10-09 NOTE — BH Specialist Note (Signed)
Integrated Behavioral Health Follow Up Visit Via Phone.  MRN: [id number] Name: Taylor Bryant  Type of Service: [place] Interpretor:No. Interpretor Name and Language: Not applicable.   SUBJECTIVE: Taylor Bryant is a 38 y.o. male accompanied by himself. Patient was referred by Staci Acosta NP for mental health. Patient reports the following symptoms/concerns: He reports that he had a panic attack on Friday and called the clinic. He explains that he told the CMA that he could not take Buspar anymore because it was causing panic attacks. He notes that despite the panic attacks that he has not missed a day of work. He explains that he feels like he has more control of his temper and is able to manage his anger better than in the past. He notes that another reason that he does no longer wants to take the Buspar is because it was causing headaches and an increase in his anger. He reports that he stopped taking the Buspar on Friday. He notes that he is willing to have to dosage to decreased to 10 mg three times a day is the right move if there are not many other options in terms of medications to help with his anxiety. He notes that he is anxious and scared about seeing the neurologist next week. He denies suicidal and homicidal thoughts.  Duration of problem: ; Severity of problem: moderate  OBJECTIVE: Mood: Euthymic and Affect: Appropriate Risk of harm to self or others: No plan to harm self or others  LIFE CONTEXT: Family and Social: See above. School/Work: See above. Self-Care: See above. Life Changes: See above.  GOALS ADDRESSED: Patient will: 1.  Reduce symptoms of: anxiety  2.  Increase knowledge and/or ability of: coping skills, healthy habits, self-management skills and stress reduction  3.  Demonstrate ability to: Increase healthy adjustment to current life circumstances  INTERVENTIONS: Interventions utilized:  Brief CBT was utilized by the  clinician focusing on the patient's anxiety and affect on normal cognition. Clinician processed with the patient regarding how he has been doing since the last follow up session. Clinician discussed with the patient regarding his recent panic attacks, severity, duration, and frequency. Clinician explained to the patient that when they talked in the last week that he declined to decrease the Buspar down to 10 mg three times a day and explained that there are not too many options left in terms of anxiety medications. Clinician explained to the patient that she thinks she needs to find ways to de-stress, practice self care, and continue to utilize his coping skills.  Standardized Assessments completed: GAD-7 and PHQ 9  ASSESSMENT: Patient currently experiencingSee above.  Patient may benefit from See above.  PLAN: 1. Follow up with behavioral health clinician on : one week or earlier.  2. Behavioral recommendations: Case consultation with Dr. Octavia Heir, MD, regarding his psychotropic medications.  3. Referral(s): [place] (In Clinic) 4. "From scale of 1-10, how likely are you to follow plan?":   Bayard Hugger, LCSW

## 2018-10-14 ENCOUNTER — Other Ambulatory Visit: Payer: Self-pay

## 2018-10-14 LAB — PARATHYROID HORMONE, INTACT (NO CA): PTH: 44 pg/mL (ref 15–65)

## 2018-10-14 LAB — VITAMIN D 1,25 DIHYDROXY
Vitamin D 1, 25 (OH)2 Total: 49 pg/mL
Vitamin D2 1, 25 (OH)2: <10 pg/mL
Vitamin D3 1, 25 (OH)2: 48 pg/mL

## 2018-10-14 LAB — CALCIUM, IONIZED: Calcium, Ion: 4.8 mg/dL (ref 4.5–5.6)

## 2018-10-14 LAB — PHOSPHORUS: Phosphorus: 3.7 mg/dL (ref 2.8–4.1)

## 2018-10-14 LAB — CK: Total CK: 116 U/L (ref 49–439)

## 2018-10-14 LAB — MAGNESIUM: Magnesium: 2.6 mg/dL — ABNORMAL HIGH (ref 1.6–2.3)

## 2018-10-14 MED ORDER — BUSPIRONE HCL 10 MG PO TABS: 10.0000 mg | ORAL_TABLET | Freq: Three times a day (TID) | ORAL | 0 refills | Status: DC

## 2018-10-15 ENCOUNTER — Encounter: Payer: Self-pay | Admitting: Urology

## 2018-10-15 ENCOUNTER — Other Ambulatory Visit: Payer: Self-pay

## 2018-10-15 ENCOUNTER — Ambulatory Visit (INDEPENDENT_AMBULATORY_CARE_PROVIDER_SITE_OTHER): Payer: BC Managed Care – PPO | Admitting: Urology

## 2018-10-15 VITALS — BP 136/68 | HR 91 | Ht 70.0 in | Wt 193.0 lb

## 2018-10-15 DIAGNOSIS — R350 Frequency of micturition: Secondary | ICD-10-CM | POA: Diagnosis not present

## 2018-10-15 LAB — BLADDER SCAN AMB NON-IMAGING
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
## 2018-10-15 NOTE — Patient Instructions (Signed)

## 2018-10-16 ENCOUNTER — Ambulatory Visit: Payer: Self-pay | Admitting: Licensed Clinical Social Worker

## 2018-10-16 ENCOUNTER — Ambulatory Visit: Payer: Self-pay

## 2018-10-16 DIAGNOSIS — F3162 Bipolar disorder, current episode mixed, moderate: Secondary | ICD-10-CM

## 2018-10-16 DIAGNOSIS — F410 Panic disorder [episodic paroxysmal anxiety] without agoraphobia: Secondary | ICD-10-CM

## 2018-10-16 DIAGNOSIS — F411 Generalized anxiety disorder: Secondary | ICD-10-CM
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
## 2018-10-23 ENCOUNTER — Other Ambulatory Visit: Payer: Self-pay | Admitting: Licensed Clinical Social Worker

## 2018-10-23 ENCOUNTER — Ambulatory Visit: Payer: Self-pay | Admitting: Licensed Clinical Social Worker

## 2018-10-23 ENCOUNTER — Other Ambulatory Visit: Payer: Self-pay

## 2018-10-23 ENCOUNTER — Ambulatory Visit: Payer: Self-pay

## 2018-10-23 DIAGNOSIS — F411 Generalized anxiety disorder: Secondary | ICD-10-CM

## 2018-10-23 DIAGNOSIS — F3162 Bipolar disorder, current episode mixed, moderate: Secondary | ICD-10-CM

## 2018-10-23 DIAGNOSIS — F410 Panic disorder [episodic paroxysmal anxiety] without agoraphobia: Secondary | ICD-10-CM
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
## 2018-10-30 ENCOUNTER — Ambulatory Visit: Payer: BC Managed Care – PPO | Admitting: Family Medicine

## 2018-10-30 ENCOUNTER — Ambulatory Visit: Payer: Self-pay | Admitting: Licensed Clinical Social Worker

## 2018-10-30 ENCOUNTER — Other Ambulatory Visit: Payer: Self-pay

## 2018-10-30 VITALS — BP 144/86 | HR 91 | Temp 98.1°F | Ht 71.0 in | Wt 200.3 lb

## 2018-10-30 DIAGNOSIS — F411 Generalized anxiety disorder: Secondary | ICD-10-CM

## 2018-10-30 DIAGNOSIS — F3162 Bipolar disorder, current episode mixed, moderate: Secondary | ICD-10-CM

## 2018-10-30 DIAGNOSIS — R519 Headache, unspecified: Secondary | ICD-10-CM

## 2018-10-30 DIAGNOSIS — F410 Panic disorder [episodic paroxysmal anxiety] without agoraphobia: Secondary | ICD-10-CM

## 2018-10-30 DIAGNOSIS — R0683 Snoring: Secondary | ICD-10-CM

## 2018-10-30 MED ORDER — VERAPAMIL HCL ER 120 MG PO TBCR: 120.0000 mg | EXTENDED_RELEASE_TABLET | Freq: Every day | ORAL | 0 refills | Status: DC
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
## 2018-10-30 NOTE — Patient Instructions (Signed)

## 2018-10-30 NOTE — BH Specialist Note (Signed)
Integrated Behavioral Health Follow Up Visit Via Phone  MRN: [id number] Name: Taylor Bryant  Type of Service: [place] Interpretor:No. Interpretor Name and Language: Not applicable.   SUBJECTIVE: Taylor Bryant is a 38 y.o. male accompanied by himself.  Patient was referred by Staci Acosta NP for mental health. Patient reports the following symptoms/concerns: He explains that he has been experiencing a lot of anxiety lately. He notes despite his anxiety that he has not missed a day of work. He asked if it was possible to increase his Lithium to assist with his anxiety and mood symptoms. He asked if Vistaril was a good alternative to Hydroxyzine. He reports that he was not aware that he had [employer] insurance and has yet to receive a card in the mail. He asked if he could continue to be a patient at [facility]. He denies suicidal and homicidal thoughts.  Duration of problem: ; Severity of problem: moderate   OBJECTIVE: Mood: Euthymic and Affect: Appropriate Risk of harm to self or others: No plan to harm self or others  LIFE CONTEXT: Family and Social: see above. School/Work: see above. Self-Care: see above. Life Changes: see above.   GOALS ADDRESSED: Patient will: 1.  Reduce symptoms of: anxiety  2.  Increase knowledge and/or ability of: coping skills  3.  Demonstrate ability to: Increase healthy adjustment to current life circumstances  INTERVENTIONS: Interventions utilized:  Brief CBT was utilized by the clinician focusing on the patient's anxiety and affect on normal cognition. Clinician processed with the patient regarding how he has been doing since the last follow up session. Clinician explained to the patient that she would have a case consultation with Dr. Octavia Heir, MD psychiatric consultant on Tuesday October 27th @ 9 am to discuss the option of increasing his Lithium. Clinician asked the patient if he was aware that  he has [place] health insurance effective July 1st 2020. Clinician informed the patient that he would have thirty days to find another provider but would be able to continue with therapy and all other services at [facility] until then. Clinician explained to the patient that he has the option to opt of his coverage through his job at [employer] if he is unable to afford the premiums and the deductible. Clinician explained to the patient that she would assist the patient with understanding his new health insurance benefits and refer out to other providers.  Standardized Assessments completed: next session.  ASSESSMENT: Patient currently experiencing see above.   Patient may benefit from see above.  PLAN: 1. Follow up with behavioral health clinician on : one week or earlier if needed. 2. Behavioral recommendations: Case consultation with Dr. Octavia Heir, MD psychiatric consultant on Tuesday November 27th @ 9 am.  3. Referral(s): [place] (In Clinic) 4. "From scale of 1-10, how likely are you to follow plan?":   Bayard Hugger, LCSW

## 2018-11-06 ENCOUNTER — Other Ambulatory Visit: Payer: Self-pay

## 2018-11-06 ENCOUNTER — Ambulatory Visit: Payer: Self-pay | Admitting: Licensed Clinical Social Worker

## 2018-11-06 DIAGNOSIS — F3162 Bipolar disorder, current episode mixed, moderate: Secondary | ICD-10-CM

## 2018-11-06 DIAGNOSIS — F410 Panic disorder [episodic paroxysmal anxiety] without agoraphobia: Secondary | ICD-10-CM

## 2018-11-06 DIAGNOSIS — F411 Generalized anxiety disorder: Secondary | ICD-10-CM

## 2018-11-06 NOTE — BH Specialist Note (Signed)
Integrated Behavioral Health Follow Up Visit Via Phone  MRN: [id number] Name: Deandrae Wajda  Type of Service: [place] Interpretor:No. Interpretor Name and Language: not applicable.  SUBJECTIVE: Tayte Mcwherter is a 38 y.o. male accompanied by herself. Patient was referred by Staci Acosta NP for mental health. Patient reports the following symptoms/concerns: He reports that he has not been in the best mood in the past week. He notes that he was pulled by a cop, accused of stealing his own car, and was charged with two misdemeanors. He explains that he has left message with his attorney and has been to his office three times but he has not been available. He notes that it was a really bad experience and he is still upset about it. He reports that he thinks he would rather waive the health insurance through his employer due to not being able to afford the deductible, and wanting to switch his mental health providers. He denies suicidal and homicidal thoughts.  Duration of problem: ; Severity of problem: moderate  OBJECTIVE: Mood: Euthymic and Affect: Appropriate Risk of harm to self or others: No plan to harm self or others  LIFE CONTEXT: Family and Social: see above. School/Work: see above. Self-Care: see above. Life Changes: see above.  GOALS ADDRESSED: Patient will: 1.  Reduce symptoms of: anxiety  2.  Increase knowledge and/or ability of: coping skills, healthy habits and self-management skills  3.  Demonstrate ability to: Increase healthy adjustment to current life circumstances  INTERVENTIONS: Interventions utilized:  Supportive Counseling was utilized by the clinician during today's follow up appointment. Clinician processed with the patient regarding how he has been doing since the last follow up session. Clinician utilized reflective listening encouraging the patient to ventilate his feelings towards his current situation. Clinician explained to  the patient that it sounds like he did not do anything wrong and it was a miscommunication between him and the cop. Clinician explained to the patient that he does have the option to waive the health insurance plan offered through his employer and suggested he speak with his manager about it. Clinician explained to the patient that she had a case consultation with Dr. Octavia Heir, MD, psychiatric consultant who wants to increase his Lithium to 450 mg twice a day and 900 mg at bedtime. Clinician provided psycho education about effects of toxicity to look out for. Clinician explained to the patient that she will schedule him a lab for approximately one week to have his lithium level checked.  Standardized Assessments completed: GAD-7 and PHQ 9  ASSESSMENT: Patient currently experiencing see above.  Patient may benefit from see above.  PLAN: 1. Follow up with behavioral health clinician on : one week or earlier if needed. 2. Behavioral recommendations: see above. 3. Referral(s): [place] (In Clinic) 4. "From scale of 1-10, how likely are you to follow plan?":   Bayard Hugger, LCSW

## 2018-11-08 ENCOUNTER — Ambulatory Visit (INDEPENDENT_AMBULATORY_CARE_PROVIDER_SITE_OTHER)
Admission: RE | Admit: 2018-11-08 | Discharge: 2018-11-08 | Disposition: A | Discharge status: Home / Self Care | Payer: BC Managed Care – PPO | Source: Ambulatory Visit

## 2018-11-08 DIAGNOSIS — Z711 Person with feared health complaint in whom no diagnosis is made: Secondary | ICD-10-CM

## 2018-11-08 NOTE — ED Provider Notes (Signed)
Virtual Visit via Video Note:  Taylor Bryant  initiated request for Telemedicine visit with [facility] team. I connected with Taylor Bryant  on 11/08/2018 at 9:51 AM  for a synchronized telemedicine visit using a video enabled HIPPA compliant telemedicine application. I verified that I am speaking with Taylor Bryant  using two identifiers. Taylor Pollio Jodell Cipro, Taylor Bryant  was physically located in a Taylor Bryant and Taylor Bryant was located at a different location.   The limitations of evaluation and management by telemedicine as well as the availability of in-person appointments were discussed. Patient was informed that he  may incur a bill ( including co-pay) for this virtual visit encounter. Taylor Bryant  expressed understanding and gave verbal consent to proceed with virtual visit.     History of Present Illness:Taylor Bryant  is a 38 y.o. male presents with possible Covid exposure.  Patient states 71-year-old daughter which is in [place], and spent time with another child who was tested positive for COVID without facial masks, within 6 ft, longer than 15 mins. Daughter was exposed 2 days ago, and patient spent time with daughter yesterday. Both daughter and patient are asymptomatic. Denies fever, chills, body aches. Denies abdominal pain, nausea, vomiting, diarrhea. Denies URI symptoms such as cough, congestion, sore throat. Denies shortness of breath, loss of taste/smell.   Past Medical History:  Diagnosis Date  . Bicuspid aortic valve   . Heart murmur   . Hyperlipidemia   . Schizoaffective disorder ([place])   . Von Willebrand disease ([place])     Allergies  Allergen Reactions  . Penicillins Rash        Observations/Objective: General: Well appearing, nontoxic, no acute distress. Sitting comfortably. Head: Normocephalic, atraumatic Eye: No conjunctival injection, eyelid swelling. EOMI ENT: Mucus membranes moist, no lip cracking. No obvious nasal drainage. Pulm: Speaking in full sentences without  difficulty. Normal effort. No respiratory distress, accessory muscle use. Neuro: Normal mental status. Alert and oriented x 3.   Assessment and Plan: Discussed with patient, he does not qualify as COVID exposure at this time for testing. If patient's daughter develops symptoms and/or tested positive for COVID, patient will qualify for testing 5 days after exposure. Discussed with patient, if testing while he is asymptomatic, could still be within incubation period. If develop symptoms, may need retesting. Patient expresses understanding and will monitor for now. Return precautions given.  Follow Up Instructions:    I discussed the assessment and treatment plan with the patient. The patient was provided an opportunity to ask questions and all were answered. The patient agreed with the plan and demonstrated an understanding of the instructions.   The patient was advised to call back or seek an in-person evaluation if the symptoms worsen or if the condition fails to improve as anticipated.  I provided 15 minutes of non-face-to-face time during this encounter.    Ok Edwards, Taylor Bryant  11/08/2018 9:51 AM         Ok Edwards, Taylor Bryant 11/08/18 1108

## 2018-11-08 NOTE — Discharge Instructions (Signed)
As discussed, at this time, you do not count as positive COVID exposure. If your daughter tests positive for COVID, you can get tested 5 days after exposure if you remain without symptoms. As discussed, negative test does not mean you do not have COVID, you may still be in incubation period. Monitor for any symptoms such as cough, congestion, shortness of breath, loss of taste/smell, fever, to start self quarantine and may need retesting. Go to the emergency department for further evaluation if you develop significant shortness of breath, cannot speak in full sentences.

## 2018-11-11 ENCOUNTER — Other Ambulatory Visit: Payer: Self-pay

## 2018-11-11 ENCOUNTER — Ambulatory Visit: Payer: Self-pay | Admitting: Licensed Clinical Social Worker

## 2018-11-11 DIAGNOSIS — F333 Major depressive disorder, recurrent, severe with psychotic symptoms: Secondary | ICD-10-CM

## 2018-11-11 DIAGNOSIS — F410 Panic disorder [episodic paroxysmal anxiety] without agoraphobia: Secondary | ICD-10-CM

## 2018-11-11 DIAGNOSIS — F411 Generalized anxiety disorder: Secondary | ICD-10-CM

## 2018-11-11 DIAGNOSIS — Z20822 Contact with and (suspected) exposure to covid-19: Secondary | ICD-10-CM

## 2018-11-11 DIAGNOSIS — F3162 Bipolar disorder, current episode mixed, moderate: Secondary | ICD-10-CM

## 2018-11-11 MED ORDER — DULOXETINE HCL 30 MG PO CPEP: 90.0000 mg | ORAL_CAPSULE | Freq: Every day | ORAL | 2 refills | Status: DC

## 2018-11-11 MED ORDER — LITHIUM CARBONATE ER 450 MG PO TBCR: EXTENDED_RELEASE_TABLET | ORAL | 2 refills | Status: DC

## 2018-11-11 NOTE — BH Specialist Note (Signed)
Integrated Behavioral Health Follow Up Visit Via Phone  MRN: [id number] Name: Taylor Bryant   Type of Service: [place] Interpretor:No. Interpretor Name and Language:   SUBJECTIVE: Taylor Bryant is a 38 y.o. male accompanied by himself. Patient was referred by Staci Acosta NP for mental health. Patient reports the following symptoms/concerns: He reports that his anxiety has increased because he contacted the pharmacy to see if his Cymbalta was at the pharmacy and they told him no. He explained that he had a couple of panic attacks on Saturday because he was around his god daughter who tested positive for COVID 19. He notes that not only is he concerned about himself but his daughter as well who was around his god daughter and was coughing last night. He notes that he has not been running a fever but has been having diarrhea. He explains that yesterday he went to work and had some issues with a Art therapist. He denies suicidal and homicidal thoughts.  Duration of problem: ; Severity of problem: moderate  OBJECTIVE: Mood: Euthymic and Affect: Appropriate Risk of harm to self or others: No plan to harm self or others  LIFE CONTEXT: Family and Social: see above. School/Work: see above. Self-Care: see above. Life Changes: see above.  GOALS ADDRESSED: Patient will: 1.  Reduce symptoms of: anxiety  2.  Increase knowledge and/or ability of: coping skills, self-management skills and stress reduction  3.  Demonstrate ability to: Increase healthy adjustment to current life circumstances  INTERVENTIONS: Interventions utilized:  Supportive Counseling was utilized by the clinician focusing on the patient's anxiety and affect on normal cognition. Clinician processed with the patient regarding how he has been doing since the last follow up session. Clinician explained to the patient that as long as he is not running a fever and have other symptoms that mirror COVID  19, then she thinks he could be in the clear. Clinician encouraged the patient to reach out to get tested at the health department and discuss his symptoms with a medical professional. Clinician encouraged the patient to continue to utilize his coping skills and practice self care.  Standardized Assessments completed: GAD-7 and PHQ 9  ASSESSMENT: Patient currently experiencing see above.   Patient may benefit from see above.  PLAN: 1. Follow up with behavioral health clinician on : one week or earlier if needed. 2. Behavioral recommendations: see above. 3. Referral(s): [place] (In Clinic) 4. "From scale of 1-10, how likely are you to follow plan?":   Bayard Hugger, LCSW

## 2018-11-12 LAB — NOVEL CORONAVIRUS, NAA: SARS-CoV-2, NAA: NOT DETECTED

## 2018-11-13 ENCOUNTER — Other Ambulatory Visit: Payer: Self-pay

## 2018-11-13 ENCOUNTER — Other Ambulatory Visit: Payer: BC Managed Care – PPO

## 2018-11-13 ENCOUNTER — Telehealth: Payer: Self-pay | Admitting: Gerontology

## 2018-11-13 DIAGNOSIS — F3162 Bipolar disorder, current episode mixed, moderate: Secondary | ICD-10-CM

## 2018-11-13 NOTE — Telephone Encounter (Signed)
Patient called to receive his negative COVID test results. Patient expressed understanding. °

## 2018-11-14 LAB — LITHIUM LEVEL: Lithium Lvl: 0.3 mmol/L — ABNORMAL LOW (ref 0.6–1.2)

## 2018-11-20 ENCOUNTER — Other Ambulatory Visit: Payer: Self-pay

## 2018-11-20 ENCOUNTER — Ambulatory Visit: Payer: Self-pay | Admitting: Licensed Clinical Social Worker

## 2018-11-20 DIAGNOSIS — F3162 Bipolar disorder, current episode mixed, moderate: Secondary | ICD-10-CM

## 2018-11-20 DIAGNOSIS — F333 Major depressive disorder, recurrent, severe with psychotic symptoms: Secondary | ICD-10-CM

## 2018-11-20 DIAGNOSIS — F410 Panic disorder [episodic paroxysmal anxiety] without agoraphobia: Secondary | ICD-10-CM

## 2018-11-20 MED ORDER — BUSPIRONE HCL 10 MG PO TABS: 10.0000 mg | ORAL_TABLET | Freq: Three times a day (TID) | ORAL | 0 refills | Status: DC

## 2018-11-20 NOTE — BH Specialist Note (Signed)
Integrated Behavioral Health Follow Up Visit Via Phone  MRN: [id number] Name: Taylor Bryant  Type of Service: [place] Interpretor:No. Interpretor Name and Language: Not applicable.   SUBJECTIVE: Taylor Bryant is a 38 y.o. male accompanied by herself. Patient was referred by Staci Acosta NP for mental health. Patient reports the following symptoms/concerns: He describes experiencing mania in the last week. He explains that he had panic attacks after a customer said racist things to him and none of his co workers that witnessed it, defended him. He notes that he made through the day but the next day he arrived at work and had another panic attack. He explains that he told his manager he was not okay and left work. He notes that his manager told him he needs to get better and take a leave of absence from work until November 21st. He explains that he cannot sleep, cry, and feels weird. He notes that he has been utilizing the sleep stories app and calm app for his anxiety. He explains that he is unable to turn off his thoughts at night. He notes that he is still having anxiety about having to switch to another therapist and psychiatrist. He explains that he second guesses his decisions and different stuff. He asked about the medication Trazodone for sleep. He denies suicidal and homicidal thoughts.  Duration of problem: ; Severity of problem: severe  OBJECTIVE: Mood: Euthymic and Affect: Appropriate Risk of harm to self or others: No plan to harm self or others  LIFE CONTEXT: Family and Social: see above. School/Work: see above. Self-Care: see above. Life Changes: see above.  GOALS ADDRESSED: Patient will: 1.  Reduce symptoms of: anxiety  2.  Increase knowledge and/or ability of: coping skills, self-management skills and stress reduction  3.  Demonstrate ability to: Increase healthy adjustment to current life circumstances  INTERVENTIONS: Interventions  utilized:  Supportive Counseling was utilized by the clinician focusing on the patient's anxiety and affect on normal cognition. Clinician processed with the patient regarding how he has been doing since the last follow up session. Clinician discussed the recent triggers for his panic attacks. Clinician explained to the patient that she would like for him to focus on what is in his control versus out of control. Clinician explained to the patient that she would have a case consultation with Dr. Constance Holster, psychiatric consultant on Tuesday November 17th @ 9 am to discuss his recent lab work and psychotropic medications. Clinician encouraged the patient to find ways to de stress and continue to utilize his coping skills.  Standardized Assessments completed: next session.  ASSESSMENT: Patient currently experiencing see above.   Patient may benefit from see above.  PLAN: 1. Follow up with behavioral health clinician on :  2. Behavioral recommendations: see above.  3. Referral(s): [place] (In Clinic) 4. "From scale of 1-10, how likely are you to follow plan?":   Bayard Hugger, LCSW

## 2018-11-27 ENCOUNTER — Ambulatory Visit: Payer: Self-pay | Admitting: Licensed Clinical Social Worker

## 2018-11-27 ENCOUNTER — Other Ambulatory Visit: Payer: Self-pay

## 2018-11-27 DIAGNOSIS — F410 Panic disorder [episodic paroxysmal anxiety] without agoraphobia: Secondary | ICD-10-CM

## 2018-11-27 DIAGNOSIS — F333 Major depressive disorder, recurrent, severe with psychotic symptoms: Secondary | ICD-10-CM

## 2018-11-27 DIAGNOSIS — F3162 Bipolar disorder, current episode mixed, moderate: Secondary | ICD-10-CM

## 2018-11-27 NOTE — BH Specialist Note (Signed)
Integrated Behavioral Health Follow Up Visit Via Phone  MRN: [id number] Name: Rocco Kerkhoff  Type of Service: [place] Interpretor:No. Interpretor Name and Language: Not applicable.   SUBJECTIVE: Lenzy Kerschner is a 38 y.o. male accompanied by herself. Patient was referred by Staci Acosta NP for mental health.  Patient reports the following symptoms/concerns: He describes having difficulty with sleep over the last week. He explains that he feels exhausted a good portion of the time. He explains that he was talking to his dad who believes that he should be out of work until December 1st and is concerned that he is heading into a mixed episode of either depression or mania that can be detrimental to his well being. He explains that he is taking his medicines and trying to not let little things get him stressed out. He denies suicidal and homicidal thoughts.  Duration of problem: ; Severity of problem: moderate  OBJECTIVE: Mood: Euthymic and Affect: Appropriate Risk of harm to self or others: No plan to harm self or others  LIFE CONTEXT: Family and Social: see above. School/Work:see above. Self-Care: see above. Life Changes: see above.   GOALS ADDRESSED: Patient will: 1.  Reduce symptoms of: anxiety  2.  Increase knowledge and/or ability of: coping skills, self-management skills and stress reduction  3.  Demonstrate ability to: Increase healthy adjustment to current life circumstances  INTERVENTIONS: Interventions utilized:  Brief CBT was utilized by the clinician focusing on the patient's anxiety and affect on normal cognition. Clinician processed with the patient regarding how he has been doing since the last follow up session. Clinician explained to the patient that she had a case consultation with Dr. Octavia Heir, MD, psychiatric consultant who recommended that Buspar 10 mg three times a day be discontinued and that Hydroxyzine 25 mg three times a day as  needed for anxiety be prescribed in its place. Clinician explained to the patient that Dr. Octavia Heir, MD also wants him to get his Lithium level redrawn so he can do it 12 hours from the last dosage to get an accurate read on his level. Clinician encouraged the patient to continue to utilize his coping skills and practice self care.  Standardized Assessments completed: GAD-7 and PHQ 9  ASSESSMENT: Patient currently experiencing see above.  Patient may benefit from see above.   PLAN: 1. Follow up with behavioral health clinician on : one week or earlier if needed. 2. Behavioral recommendations:  Dr. Octavia Heir, MD, psychiatric consultant recommended that  Buspar 10 mg three times a day be discontinued and start Hydroxyzine 25 mg three times a day prn for anxiety.  3. Referral(s): [place] (In Clinic) 4. "From scale of 1-10, how likely are you to follow plan?":   Bayard Hugger, LCSW

## 2018-12-01 ENCOUNTER — Other Ambulatory Visit: Payer: Self-pay

## 2018-12-01 MED ORDER — HYDROXYZINE HCL 25 MG PO TABS: 25.0000 mg | ORAL_TABLET | Freq: Three times a day (TID) | ORAL | 0 refills | Status: DC

## 2018-12-02 ENCOUNTER — Other Ambulatory Visit: Payer: Self-pay

## 2018-12-02 ENCOUNTER — Ambulatory Visit: Payer: Self-pay | Admitting: Licensed Clinical Social Worker

## 2018-12-02 DIAGNOSIS — F3162 Bipolar disorder, current episode mixed, moderate: Secondary | ICD-10-CM

## 2018-12-02 DIAGNOSIS — F410 Panic disorder [episodic paroxysmal anxiety] without agoraphobia: Secondary | ICD-10-CM

## 2018-12-02 DIAGNOSIS — F411 Generalized anxiety disorder: Secondary | ICD-10-CM

## 2018-12-02 NOTE — BH Specialist Note (Signed)
Integrated Behavioral Health Follow Up Visit Via Phone  MRN: [id number] Name: Jaysun Wessels  Type of Service: [place] Interpretor:No. Interpretor Name and Language: Not applicable.   SUBJECTIVE: Zakaree Mcclenahan is a 38 y.o. male accompanied by himself. Patient was referred by Staci Acosta, NP for mental health. Patient reports the following symptoms/concerns:  He reports that his anxiety is still high but he is feeling better. He notes that he has been walking for an hour and half each day to help get his relief from his anxiety. He reports that he is continuing to utilize his coping skills. He explains that he spoke with his job who will adjust his work hours so he does not have to come in so early in the morning and hopefully get adequate sleep at night. He explains that he will be able to cancel his health insurance on December 1st. He denies suicidal and homicidal thoughts.  Duration of problem: ; Severity of problem: moderate  OBJECTIVE: Mood: Euthymic and Affect: Appropriate Risk of harm to self or others: No plan to harm self or others  LIFE CONTEXT: Family and Social: see above. School/Work: see above. Self-Care: see above. Life Changes: see above.  GOALS ADDRESSED: Patient will: 1.  Reduce symptoms of: anxiety  2.  Increase knowledge and/or ability of: coping skills and self-management skills  3.  Demonstrate ability to: Increase healthy adjustment to current life circumstances  INTERVENTIONS: Interventions utilized:  Brief CBT was utilized by the clinician during today's follow up session focusing on the patient's anxiety and affect on normal cognition. Clinician processed with the patient regarding how she has been doing since the last follow up session. Clinician explained to the patient that it sounds like he seems to have a better outlook on things and is more positive. Clinician encouraged the patient to continue to utilize his coping  skills and practice self care.  Standardized Assessments completed: next session  ASSESSMENT: Patient currently experiencing see above.  Patient may benefit from see above.  PLAN: 1. Follow up with behavioral health clinician on : one week or earlier if needed.  2. Behavioral recommendations: Patient will redo his lithium level tomorrow on November 25th at 10 am. He was instructed to take his last dosage of Lithium 12 hours before to get a accurate reading of his levels.  3. Referral(s): [place] (In Clinic) 4. "From scale of 1-10, how likely are you to follow plan?":   Bayard Hugger, LCSW

## 2018-12-02 NOTE — Progress Notes (Signed)
Lithium

## 2018-12-03 ENCOUNTER — Other Ambulatory Visit: Payer: BC Managed Care – PPO

## 2018-12-06 ENCOUNTER — Ambulatory Visit (INDEPENDENT_AMBULATORY_CARE_PROVIDER_SITE_OTHER)
Admission: EM | Admit: 2018-12-06 | Discharge: 2018-12-06 | Disposition: A | Discharge status: Home / Self Care | Payer: BC Managed Care – PPO | Source: Home / Self Care | Attending: Emergency Medicine | Admitting: Emergency Medicine

## 2018-12-06 ENCOUNTER — Encounter: Payer: Self-pay | Admitting: Emergency Medicine

## 2018-12-06 ENCOUNTER — Emergency Department: Payer: BC Managed Care – PPO

## 2018-12-06 ENCOUNTER — Encounter: Payer: Self-pay | Admitting: Gynecology

## 2018-12-06 ENCOUNTER — Other Ambulatory Visit: Payer: Self-pay

## 2018-12-06 ENCOUNTER — Emergency Department
Admission: EM | Admit: 2018-12-06 | Discharge: 2018-12-06 | Disposition: A | Discharge status: Home / Self Care | Payer: BC Managed Care – PPO | Attending: Emergency Medicine | Admitting: Emergency Medicine

## 2018-12-06 DIAGNOSIS — R519 Headache, unspecified: Secondary | ICD-10-CM | POA: Diagnosis present

## 2018-12-06 DIAGNOSIS — M7918 Myalgia, other site: Secondary | ICD-10-CM | POA: Insufficient documentation

## 2018-12-06 DIAGNOSIS — Z87891 Personal history of nicotine dependence: Secondary | ICD-10-CM | POA: Insufficient documentation

## 2018-12-06 DIAGNOSIS — Z79899 Other long term (current) drug therapy: Secondary | ICD-10-CM | POA: Diagnosis not present

## 2018-12-06 DIAGNOSIS — J111 Influenza due to unidentified influenza virus with other respiratory manifestations: Secondary | ICD-10-CM | POA: Insufficient documentation

## 2018-12-06 DIAGNOSIS — F419 Anxiety disorder, unspecified: Secondary | ICD-10-CM | POA: Insufficient documentation

## 2018-12-06 DIAGNOSIS — R1110 Vomiting, unspecified: Secondary | ICD-10-CM | POA: Insufficient documentation

## 2018-12-06 DIAGNOSIS — R6889 Other general symptoms and signs: Secondary | ICD-10-CM

## 2018-12-06 DIAGNOSIS — J45909 Unspecified asthma, uncomplicated: Secondary | ICD-10-CM | POA: Diagnosis not present

## 2018-12-06 DIAGNOSIS — R251 Tremor, unspecified: Secondary | ICD-10-CM | POA: Insufficient documentation

## 2018-12-06 LAB — COMPREHENSIVE METABOLIC PANEL
ALT: 20 U/L (ref 0–44)
AST: 22 U/L (ref 15–41)
Albumin: 4.5 g/dL (ref 3.5–5.0)
Alkaline Phosphatase: 81 U/L (ref 38–126)
Anion gap: 10 (ref 5–15)
BUN: 13 mg/dL (ref 6–20)
CO2: 24 mmol/L (ref 22–32)
Calcium: 9.2 mg/dL (ref 8.9–10.3)
Chloride: 106 mmol/L (ref 98–111)
Creatinine, Ser: 0.87 mg/dL (ref 0.61–1.24)
GFR calc Af Amer: >60 mL/min (ref >60)
GFR calc non Af Amer: >60 mL/min (ref >60)
Glucose, Bld: 104 mg/dL — ABNORMAL HIGH (ref 70–99)
Potassium: 4.3 mmol/L (ref 3.5–5.1)
Sodium: 140 mmol/L (ref 135–145)
Total Bilirubin: 0.6 mg/dL (ref 0.3–1.2)
Total Protein: 8.4 g/dL — ABNORMAL HIGH (ref 6.5–8.1)

## 2018-12-06 LAB — URINALYSIS, COMPLETE (UACMP) WITH MICROSCOPIC
Bacteria, UA: NONE SEEN
Bilirubin Urine: NEGATIVE
Glucose, UA: NEGATIVE mg/dL
Hgb urine dipstick: NEGATIVE
Ketones, ur: NEGATIVE mg/dL
Leukocytes,Ua: NEGATIVE
Nitrite: NEGATIVE
Protein, ur: NEGATIVE mg/dL
Specific Gravity, Urine: 1.021 (ref 1.005–1.030)
pH: 6 (ref 5.0–8.0)

## 2018-12-06 LAB — CBC
HCT: 44.3 % (ref 39.0–52.0)
Hemoglobin: 15.1 g/dL (ref 13.0–17.0)
MCH: 31.8 pg (ref 26.0–34.0)
MCHC: 34.1 g/dL (ref 30.0–36.0)
MCV: 93.3 fL (ref 80.0–100.0)
Platelets: 321 10*3/uL (ref 150–400)
RBC: 4.75 MIL/uL (ref 4.22–5.81)
RDW: 12.5 % (ref 11.5–15.5)
WBC: 9.1 10*3/uL (ref 4.0–10.5)
nRBC: 0 % (ref 0.0–0.2)

## 2018-12-06 LAB — LIPASE, BLOOD: Lipase: 26 U/L (ref 11–51)

## 2018-12-06 LAB — PROTIME-INR
INR: 1 (ref 0.8–1.2)
Prothrombin Time: 12.8 seconds (ref 11.4–15.2)

## 2018-12-06 MED ORDER — ONDANSETRON HCL 4 MG PO TABS: 4.0000 mg | ORAL_TABLET | Freq: Every day | ORAL | 0 refills | Status: AC | PRN

## 2018-12-06 MED ORDER — SODIUM CHLORIDE 0.9 % IV BOLUS
1000.0000 mL | Freq: Once | INTRAVENOUS | Status: AC
  Administered 2018-12-06: 20:00:00 1000 mL via INTRAVENOUS

## 2018-12-06 MED ORDER — ONDANSETRON HCL 4 MG/2ML IJ SOLN
4.0000 mg | Freq: Once | INTRAMUSCULAR | Status: AC
  Administered 2018-12-06: 4 mg via INTRAVENOUS
  Filled 2018-12-06: qty 2

## 2018-12-06 NOTE — ED Triage Notes (Signed)
FIRST NURSE NOTE:  Pt here from [facility], sent due to headache x 6 days.

## 2018-12-06 NOTE — Discharge Instructions (Signed)
Go immediately to the [facility] emergency department.  Let them know if your headache gets worse.

## 2018-12-06 NOTE — ED Provider Notes (Signed)
[facility] Emergency Department Provider Note  ____________________________________________  Time seen: Approximately 8:08 PM  I have reviewed the triage vital signs and the nursing notes.   HISTORY  Chief Complaint Generalized Body Aches and Headache    HPI Pleas PatriciaLuis Brum is a 38 y.o. male with PMH of migraines, bipolar, panic attacks, anxiety, smoking, von Willebrand's that presents to the emergency department for evaluation of 5-day intermittent frontal headaches.  Patient states that headaches happen in the morning and improves throughout the day.  Patient denies any headache currently.  Last headache was prior to noon today.  Patient also states that his gums have been bleeding in the morning.  He has had vomiting and diarrhea for the last 3 days as well.  Last episode of diarrhea was early this morning.  Last episode of vomiting was yesterday.  He has had some stomach discomfort but states that this is likely because he has not eaten today.  He has been too anxious today to eat.  He is also having tremors to his arms, worse on the left, which she has had his whole life.  Tremors come and go regularly and and have returned this week.  Patient states that these tremors started when he was a small child.  Patient has also been using a chemical peel to his feet which seems to be irritating them. He has body aches occasionally.  Patient states that he is pretty sure today that his headache was caused by his ex-wife.  He has joint custody with her over their daughter.  He states that she is giving him a lot of anxiety.  He also states that he has anxiety because his 40 year old father is in [place].  He was supposed to visit him this month and decided not to visit due to COVID-19.  He states that [place] has a very high rate of COVID-19 and he was worried about bringing it back to his daughter.  It worries him that he will not be visiting however. Patient states that he has  not smoked since early this year but had to smoke yesterday to calm his nerves.   He drove himself to the ER.  He switched from BuSpar to Vistaril on Monday.  Headaches started after he switched to Vistaril.  He discontinued BuSpar because he was having insomnia.  He does not wish to speak with anyone today regarding his anxiety.  No SI or HI. He drove to the Covid testing clinic today but they were closed.  This is when he decided to go to urgent care.  He is concerned that he may have Covid and will accidentally pass it to his daughter.  No fevers.   Past Medical History:  Diagnosis Date  . Bicuspid aortic valve   . Heart murmur   . Hyperlipidemia   . Schizoaffective disorder (HCC)   . Von Willebrand disease [facility](HCC)     Patient Active Problem List   Diagnosis Date Noted  . Headache 09/25/2018  . Chronic headache 09/25/2018  . Hypocalcemia 09/25/2018  . Fatigue 09/25/2018  . Hyperlipidemia 07/10/2018  . Generalized anxiety disorder 07/10/2018  . Bipolar disorder, current episode mixed, moderate (HCC) 05/01/2018  . History of aortic valve disorder 12/10/2017  . Bicuspid aortic valve 12/10/2017  . Von Willebrand disease (HCC) 09/26/2017  . Asthma 09/17/2014    Past Surgical History:  Procedure Laterality Date  . TOOTH EXTRACTION      Prior to Admission medications   Medication  Sig Start Date End Date Taking? Authorizing Provider  atorvastatin (LIPITOR) 40 MG tablet Take 1 tablet (40 mg total) by mouth daily at 6 PM. 06/05/18   Iloabachie, Chioma E, NP  DULoxetine (CYMBALTA) 30 MG capsule Take 3 capsules (90 mg total) by mouth daily. 11/11/18 12/11/18  Michiel Cowboy A, PA-C  hydrOXYzine (ATARAX/VISTARIL) 25 MG tablet Take 1 tablet (25 mg total) by mouth 3 (three) times daily. 12/01/18   Michiel Cowboy A, PA-C  lithium carbonate (ESKALITH) 450 MG CR tablet Take 450mg  BID and 900mg  QHS 11/11/18   McGowan, A, PA-C  ondansetron (ZOFRAN) 4 MG tablet Take 1 tablet (4 mg total) by  mouth daily as needed for nausea or vomiting. 12/06/18 12/06/19  12/08/18, PA-C  tadalafil (CIALIS) 5 MG tablet Take 1 tablet (5 mg total) by mouth daily as needed for up to 30 days for erectile dysfunction. 06/18/18 12/06/18  08/18/18, MD  verapamil (CALAN-SR) 120 MG CR tablet Take 1 tablet (120 mg total) by mouth at bedtime. 10/30/18   Jerilee Field, FNP  fluticasone (FLONASE) 50 MCG/ACT nasal spray Place 1-2 sprays into both nostrils daily. Patient not taking: Reported on 10/30/2018 07/07/18 12/06/18  Wieters, 07/09/18 C, PA-C    Allergies Penicillins  Family History  Problem Relation Age of Onset  . Heart Problems Father     Social History Social History   Tobacco Use  . Smoking status: Former Smoker    Packs/day: 0.25    Years: 16.00    Pack years: 4.00    Types: Cigarettes    Quit date: 2016    Years since quitting: 4.9  . Smokeless tobacco: Never Used  Substance Use Topics  . Alcohol use: Not Currently  . Drug use: Not Currently     Review of Systems  Constitutional: No fever/chills. No dizziness. ENT: No upper respiratory complaints.  No nasal drainage, sinus pain, pressure, neck stiffness, visual changes, jaw pain. Cardiovascular: No chest pain.  Respiratory: No cough. No SOB. Gastrointestinal: No abdominal pain.  No nausea. Positive for vomiting yesterday, none today. Positive for diarrhea. No blood in stools. Genitourinary: Negative for dysuria.  No hematuria. Musculoskeletal: Positive for body aches. Skin: Negative for rash, abrasions, lacerations, ecchymosis. Neurological: Positive for headaches; no headache currently.   ____________________________________________   PHYSICAL EXAM:  VITAL SIGNS: ED Triage Vitals  Enc Vitals Group     BP 12/06/18 1729 (!) 177/83     Pulse Rate 12/06/18 1729 92     Resp 12/06/18 1729 20     Temp 12/06/18 1729 99 F (37.2 C)     Temp Source 12/06/18 1729 Oral     SpO2 12/06/18 1729 100 %     Weight  12/06/18 1730 186 lb (84.4 kg)     Height 12/06/18 1730 5\' 10"  (1.778 m)     Head Circumference --      Peak Flow --      Pain Score 12/06/18 1729 10     Pain Loc --      Pain Edu? --      Excl. in GC? --      Constitutional: Alert and oriented. Well appearing and in no acute distress. Eyes: Conjunctivae are normal. PERRL. EOMI. Head: Atraumatic. ENT:      Ears:      Nose: No congestion/rhinnorhea.      Mouth/Throat: Mucous membranes are moist.  Neck: No stridor.   Cardiovascular: Normal rate, regular rhythm.  Good peripheral circulation. Respiratory: Normal  respiratory effort without tachypnea or retractions. Lungs CTAB. Good air entry to the bases with no decreased or absent breath sounds. Gastrointestinal: Bowel sounds 4 quadrants. Soft and nontender to palpation. No guarding or rigidity. No palpable masses. No distention.  Musculoskeletal: Full range of motion to all extremities. No gross deformities appreciated. Neurologic:  Normal speech and language. No gross focal neurologic deficits are appreciated.  Skin:  Skin is warm, dry and intact. No rash noted. Psychiatric: Mood and affect are normal. Speech and behavior are normal. Patient exhibits appropriate insight and judgement.   ____________________________________________   LABS (all labs ordered are listed, but only abnormal results are displayed)  Labs Reviewed  COMPREHENSIVE METABOLIC PANEL - Abnormal; Notable for the following components:      Result Value   Glucose, Bld 104 (*)    Total Protein 8.4 (*)    All other components within normal limits  URINALYSIS, COMPLETE (UACMP) WITH MICROSCOPIC - Abnormal; Notable for the following components:   Color, Urine YELLOW (*)    APPearance CLEAR (*)    All other components within normal limits  CBC  PROTIME-INR  LIPASE, BLOOD   ____________________________________________  EKG   ____________________________________________  RADIOLOGY Lexine Baton,  personally viewed and evaluated these images (plain radiographs) as part of my medical decision making, as well as reviewing the written report by the radiologist.  Ct Head Wo Contrast  Result Date: 12/06/2018 CLINICAL DATA:  Headache, generalized body ache and loose stools EXAM: CT HEAD WITHOUT CONTRAST TECHNIQUE: Contiguous axial images were obtained from the base of the skull through the vertex without intravenous contrast. COMPARISON:  CTA head 01/29/2018 FINDINGS: Brain: No evidence of acute infarction, hemorrhage, hydrocephalus, extra-axial collection or mass lesion/mass effect. Vascular: No hyperdense vessel or unexpected calcification. Skull: No calvarial fracture or suspicious osseous lesion. No scalp swelling or hematoma. Sinuses/Orbits: Paranasal sinuses and left mastoid air cells are predominantly clear. Chronic right mastoid effusion. Included orbital structures are unremarkable. Other: None IMPRESSION: 1. No acute intracranial abnormality. 2. Chronic right mastoid effusion. Electronically Signed   By: Kreg Shropshire M.D.   On: 12/06/2018 19:27    ____________________________________________    PROCEDURES  Procedure(s) performed:    Procedures    Medications  sodium chloride 0.9 % bolus 1,000 mL (0 mLs Intravenous Stopped 12/06/18 2125)  ondansetron (ZOFRAN) injection 4 mg (4 mg Intravenous Given 12/06/18 2010)     ____________________________________________   INITIAL IMPRESSION / ASSESSMENT AND PLAN / ED COURSE  Pertinent labs & imaging results that were available during my care of the patient were reviewed by me and considered in my medical decision making (see chart for details).  Review of the [place] CSRS was performed in accordance of the [facility] prior to dispensing any controlled drugs.   Patient's diagnosis is consistent with anxiety and viral illness.  Vital signs and exam are reassuring.  Lab work largely unremarkable.  CT scan is negative for acute abnormalities.   Following fluids and Zofran, patient feels "great and energized." He has been eating graham crackers.  He is still hungry.  His abdominal discomfort has resolved.  Patient does not feel that he can give [place] a stool sample today.  He has not had a headache while in the emergency department.  He declines talking to psychiatry.  No SI or HI.  I suspect that anxiety has a strong component to his symptoms.  He will follow up with primary care on Monday.  He will discontinue taking the Vistaril currently.  Patient will be discharged home with prescriptions for zofran. Patient is to follow up with PCP as directed. Patient is given ED precautions to return to the ED for any worsening or new symptoms.   Anil Havard was evaluated in Emergency Department on 12/06/2018 for the symptoms described in the history of present illness. He was evaluated in the context of the global COVID-19 pandemic, which necessitated consideration that the patient might be at risk for infection with the SARS-CoV-2 virus that causes COVID-19. Institutional protocols and algorithms that pertain to the evaluation of patients at risk for COVID-19 are in a state of rapid change based on information released by regulatory bodies including the CDC and federal and state organizations. These policies and algorithms were followed during the patient's care in the ED.  ____________________________________________  FINAL CLINICAL IMPRESSION(S) / ED DIAGNOSES  Final diagnoses:  Flu-like symptoms  Anxiety      NEW MEDICATIONS STARTED DURING THIS VISIT:  ED Discharge Orders         Ordered    ondansetron (ZOFRAN) 4 MG tablet  Daily PRN     12/06/18 2121              This chart was dictated using voice recognition software/Dragon. Despite best efforts to proofread, errors can occur which can change the meaning. Any change was purely unintentional.    Laban Emperor, PA-C 12/06/18 2315    Harvest Dark, MD 12/07/18 [phone]

## 2018-12-06 NOTE — ED Provider Notes (Signed)
HPI  SUBJECTIVE:  Taylor Bryant is a 37 y.o. male who reports 5 days of intermittent hours long frontal and temporal headaches.  He states that he wakes up with them.  He reports photophobia, gingival bleeding during this time.  He has had epistaxis for 2 weeks.  He has tried Excedrin, Vistaril.  The Excedrin helps.  Symptoms are worse with light.  No antipyretic in the past 4 to 6 hours.  No fevers, nasal congestion, purulent nasal drainage, neck stiffness, visual changes.  No arm or leg weakness, facial droop, slurred speech.  No sinus pain or pressure, ear pain, dental pain, jaw pain, seizures, syncope.  No melena, hematochezia, hematuria.  He states that these headaches started shortly after starting Vistaril.  Not take any Vistaril today states that these headaches are much more intense than his migraines.  He states that he had "the worst headache of my life" on Tuesday.  It has not gotten any worse today.  He also reports watery, nonbloody diarrhea for the past 3 days, nausea, mild abdominal pain, 2 episodes of emesis several days ago but is now tolerating p.o.  He states that the symptoms started after eating at a restaurant.  No known exposure to Covid.  Reports body aches starting today, sore throat.  No cough, shortness of breath.  Has not tried anything for his diarrhea.  No alleviating factors.  Symptoms are worse with smoking, eating, drinking.  Past medical history of migraines, bipolar, panic attacks, anxiety, smoking, von Willebrand's disease.  No history of diabetes, hypertension, stroke, aneurysm, seizures, HIV.  PMD: Dr. Criss Alvine  Past Medical History:  Diagnosis Date  . Bicuspid aortic valve   . Heart murmur   . Hyperlipidemia   . Schizoaffective disorder ([place])   . Von Willebrand disease ([place])     Past Surgical History:  Procedure Laterality Date  . TOOTH EXTRACTION      Family History  Problem Relation Age of Onset  . Heart Problems Father     Social History    Tobacco Use  . Smoking status: Former Smoker    Packs/day: 0.25    Years: 16.00    Pack years: 4.00    Types: Cigarettes    Quit date: 2016    Years since quitting: 4.9  . Smokeless tobacco: Never Used  Substance Use Topics  . Alcohol use: Not Currently  . Drug use: Not Currently    No current facility-administered medications for this encounter.   Current Outpatient Medications:  .  atorvastatin (LIPITOR) 40 MG tablet, Take 1 tablet (40 mg total) by mouth daily at 6 PM., Disp: 30 tablet, Rfl: 2 .  DULoxetine (CYMBALTA) 30 MG capsule, Take 3 capsules (90 mg total) by mouth daily., Disp: 90 capsule, Rfl: 2 .  hydrOXYzine (ATARAX/VISTARIL) 25 MG tablet, Take 1 tablet (25 mg total) by mouth 3 (three) times daily., Disp: 30 tablet, Rfl: 0 .  lithium carbonate (ESKALITH) 450 MG CR tablet, Take 450mg  BID and 900mg  QHS, Disp: 120 tablet, Rfl: 2 .  tadalafil (CIALIS) 5 MG tablet, Take 1 tablet (5 mg total) by mouth daily as needed for up to 30 days for erectile dysfunction., Disp: 30 tablet, Rfl: 5 .  verapamil (CALAN-SR) 120 MG CR tablet, Take 1 tablet (120 mg total) by mouth at bedtime., Disp: 30 tablet, Rfl: 0  Allergies  Allergen Reactions  . Penicillins Rash     ROS  As noted in HPI.   Physical Exam  BP Marland Kitchen)  154/91 (BP Location: Left Arm)   Pulse 81   Temp 99.1 F (37.3 C) (Oral)   Resp 18   Ht 5\' 10"  (1.778 m)   Wt 83.9 kg   SpO2 99%   BMI 26.54 kg/m   Constitutional: Well developed, well nourished, no acute distress.  Appears anxious. Eyes: PERRL, EOMI, conjunctiva normal bilaterally.  Mild direct photophobia right eye HENT: Normocephalic, atraumatic,mucus membranes moist, normal dentition.  TM normal b/l. No TMJ tenderness. Normal dentition. No nasal congestion, no sinus tenderness. No temporal artery tenderness.  Neck: no cervical LN - trapezial muscle tenderness. Rigid neck movements.  Respiratory: normal inspiratory effort Cardiovascular: Normal rate, regular  rhythm GI:  nondistended skin: No rash, skin intact Musculoskeletal: No edema, no tenderness, no deformities Neurologic: Alert & oriented x 3, CN III-XII intact, romberg neg, finger-> nose tremulous, heel-> shin equal b/l, Romberg neg, tandem gait steady Psychiatric: Speech and behavior appropriate   ED Course  Medications - No data to display  Orders Placed This Encounter  Procedures  . Novel Coronavirus, NAA (Hosp order, Send-out to Ref Lab; TAT 18-24 hrs    Standing Status:   Standing    Number of Occurrences:   1    Order Specific Question:   Is this test for diagnosis or screening    Answer:   Screening    Order Specific Question:   Symptomatic for COVID-19 as defined by CDC    Answer:   Yes    Order Specific Question:   Date of Symptom Onset    Answer:   12/01/2018    Order Specific Question:   Hospitalized for COVID-19    Answer:   Yes    Order Specific Question:   Admitted to ICU for COVID-19    Answer:   No    Order Specific Question:   Previously tested for COVID-19    Answer:   Yes    Order Specific Question:   Resident in a congregate (group) care setting    Answer:   No    Order Specific Question:   Employed in healthcare setting    Answer:   No   No results found for this or any previous visit (from the past 24 hour(s)). No results found.   ED Clinical Impression  1. Acute nonintractable headache, unspecified headache type     ED Assessment/Plan  Patient spontaneously volunteers that these are the worst headaches of his life, he has a history of von Willebrand disease and is reporting epistaxis, gingival bleeding.  States that these headaches are different than his usual migraines.  Transferring to the ED for further evaluation.  Wonder if there is not a component of anxiety to this, however cannot rule out serious cause of his headache.  It has been 5 days since his headaches started, has no neurologic deficits, feel that he is stable to go by private  vehicle.  Discussed medical decision-making, rationale for transfer to the [facility] emergency department.  Patient agrees with plan.  No orders of the defined types were placed in this encounter.   *This clinic note was created using Dragon dictation software. Therefore, there may be occasional mistakes despite careful proofreading.  ?   [facility], MD 12/06/18 1731

## 2018-12-06 NOTE — ED Triage Notes (Signed)
Per patient with headache x 6 days/ diarrhea x 3 days after eating  Meal at [employer].

## 2018-12-06 NOTE — ED Notes (Addendum)
Pt states he has had NV since Thursday. Stomach distended and tender but not firm. Pt states he last had emesis at 2 am

## 2018-12-06 NOTE — Discharge Instructions (Signed)
Please discontinue the Vistaril until you follow-up with primary care.  Please be sure to drink plenty of fluids.  You can take Zofran for nausea.  Please follow-up with your urgent care Covid results.  Return to the emergency department for any worsening of symptoms.  Call primary care on Monday for an appointment for recheck.

## 2018-12-06 NOTE — ED Triage Notes (Signed)
Generalized body aches, headache and loose stools x 4 days. Denies cough.

## 2018-12-07 LAB — NOVEL CORONAVIRUS, NAA (HOSP ORDER, SEND-OUT TO REF LAB; TAT 18-24 HRS): SARS-CoV-2, NAA: NOT DETECTED

## 2018-12-10 ENCOUNTER — Other Ambulatory Visit: Payer: Self-pay

## 2018-12-10 ENCOUNTER — Other Ambulatory Visit: Payer: BC Managed Care – PPO

## 2018-12-10 DIAGNOSIS — F3162 Bipolar disorder, current episode mixed, moderate: Secondary | ICD-10-CM

## 2018-12-11 ENCOUNTER — Ambulatory Visit: Payer: Self-pay | Admitting: Licensed Clinical Social Worker

## 2018-12-11 DIAGNOSIS — F410 Panic disorder [episodic paroxysmal anxiety] without agoraphobia: Secondary | ICD-10-CM

## 2018-12-11 DIAGNOSIS — F411 Generalized anxiety disorder: Secondary | ICD-10-CM

## 2018-12-11 DIAGNOSIS — F3162 Bipolar disorder, current episode mixed, moderate: Secondary | ICD-10-CM

## 2018-12-11 LAB — LITHIUM LEVEL: Lithium Lvl: 0.8 mmol/L (ref 0.6–1.2)

## 2018-12-11 NOTE — BH Specialist Note (Signed)
Integrated Behavioral Health Follow Up Visit Via Phone  MRN: [id number] Name: Taylor Bryant  Type of Service: [place] Interpretor:No. Interpretor Name and Language: not applicable.   SUBJECTIVE: Taylor Bryant is a 38 y.o. male accompanied by himself. Patient was referred by Staci Acosta NP for mental health. Patient reports the following symptoms/concerns: He reports that he is struggling with sleep. He notes that he ended up going to the ER on November 28th due to not feeling well and was worried that he had developed diabetes. He explains that he thinks that an argument with his ex wife and thinks that what triggered the vomiting and stomach pain. He explains that he thinks he had symptoms of mania as evidenced by forgetting vital information when he went to the hospital and experienced hallucinations. He explains that his trip to the emergency room occurred after having a really bad argument with his ex wife. He denies suicidal and homicidal thoughts.  Duration of problem: ; Severity of problem: moderate  OBJECTIVE: Mood: Euthymic and Affect: Appropriate Risk of harm to self or others: No plan to harm self or others  LIFE CONTEXT: Family and Social: see above. School/Work: see above. Self-Care: see above. Life Changes: see above.  GOALS ADDRESSED: Patient will: 1.  Reduce symptoms of: anxiety and stress  2.  Increase knowledge and/or ability of: stress reduction  3.  Demonstrate ability to: Increase healthy adjustment to current life circumstances  INTERVENTIONS: Interventions utilized:  Supportive Counseling was utilized by the clinician during today's follow up session. Clinician processed with the patient regarding how he has been doing since the last follow up session. Clinician explained to the patient that she thinks the lack of sleep is due to the amount of stress and anxiety that he has been experiencing. Clinician provided psycho  education about somatization in regards to his panic attacks and physical symptoms. Clinician explained to the patient that its important that he practice self care and continue to utilize his coping skills.  Standardized Assessments completed: GAD-7 and PHQ 9  ASSESSMENT: Patient currently experiencing see above.  Patient may benefit from see above.   PLAN: 1. Follow up with behavioral health clinician on : one week or earlier if needed. 2. Behavioral recommendations: Lithium level completed on 12/10/2018 was a 0.8.  3. Referral(s): [place] (In Clinic) 4. "From scale of 1-10, how likely are you to follow plan?":   Bayard Hugger, LCSW

## 2018-12-18 ENCOUNTER — Other Ambulatory Visit: Payer: Self-pay

## 2018-12-18 ENCOUNTER — Ambulatory Visit: Payer: Self-pay | Admitting: Licensed Clinical Social Worker

## 2018-12-18 DIAGNOSIS — F410 Panic disorder [episodic paroxysmal anxiety] without agoraphobia: Secondary | ICD-10-CM

## 2018-12-18 DIAGNOSIS — F411 Generalized anxiety disorder: Secondary | ICD-10-CM

## 2018-12-18 DIAGNOSIS — F3162 Bipolar disorder, current episode mixed, moderate: Secondary | ICD-10-CM

## 2018-12-18 NOTE — BH Specialist Note (Signed)
Integrated Behavioral Health Follow Up Visit Via Phone  MRN: [id number] Name: Chayim Bialas  Type of Service: [place] Interpretor:No. Interpretor Name and Language:   SUBJECTIVE: Oluwafemi Villella is a 38 y.o. male accompanied by himself. Patient was referred by Staci Acosta NP for mental health. Patient reports the following symptoms/concerns: He explains that he has been having a difficult time in the last week. He explains that he experienced a situational stressor at his ex wife's house when he went to see his daughter. He describes feeling down and depressed over the last week. He explains that he is over thinking things. He notes that he spoke to his boss at [employer] who told him that the insurance will be cancelled. He explains that he has racing thoughts at night when he is trying to sleep and is unable to fall asleep. He notes that [employer] has encouraged him to take some time off but he will need paperwork filled out and plans to bring it to the clinic today. He explains that he is having problems falling asleep. He denies consuming any caffeine. He denies suicidal and homicidal thoughts.  Duration of problem: ; Severity of problem: moderate  OBJECTIVE: Mood: Euthymic and Affect: Appropriate Risk of harm to self or others: No plan to harm self or others  LIFE CONTEXT: Family and Social: see above. School/Work: see above. Self-Care: see above. Life Changes: see above.  GOALS ADDRESSED: Patient will: 1.  Reduce symptoms of: depression  2.  Increase knowledge and/or ability of: coping skills, healthy habits and stress reduction  3.  Demonstrate ability to: Increase healthy adjustment to current life circumstances  INTERVENTIONS: Interventions utilized:  Supportive Counseling was utilized by the clinician during today's follow up session. Clinician processed with the patient regarding how she has been doing since the last follow up session.  Clinician utilized reflective listening encouraging the patient to ventilate his feelings towards his current situation. Clinician processed with the patient regarding the external and internal factors that are contributing to his symptoms of depression in the last week. Clinician explained to the patient that its important to practice self care, continue to utilize his coping skills, and take his medications as prescribed.  Standardized Assessments completed: next session  ASSESSMENT: Patient currently experiencing see above.  Patient may benefit from see above.  PLAN: 1. Follow up with behavioral health clinician on : one week or earlier if needed. 2. Behavioral recommendations: see above. 3. Referral(s): [place] (In Clinic) 4. "From scale of 1-10, how likely are you to follow plan?":   Bayard Hugger, LCSW

## 2018-12-25 ENCOUNTER — Other Ambulatory Visit: Payer: Self-pay

## 2018-12-25 ENCOUNTER — Ambulatory Visit: Payer: Self-pay | Admitting: Licensed Clinical Social Worker

## 2018-12-25 DIAGNOSIS — F410 Panic disorder [episodic paroxysmal anxiety] without agoraphobia: Secondary | ICD-10-CM

## 2018-12-25 DIAGNOSIS — F3162 Bipolar disorder, current episode mixed, moderate: Secondary | ICD-10-CM

## 2018-12-25 DIAGNOSIS — F411 Generalized anxiety disorder: Secondary | ICD-10-CM

## 2018-12-25 NOTE — BH Specialist Note (Signed)
Integrated Behavioral Health Follow Up Visit Via Phone  MRN: [id number] Name: Taylor Bryant  Type of Service: [place] Interpretor:No. Interpretor Name and Language: not applicable.   SUBJECTIVE: Taylor Bryant is a 38 y.o. male accompanied by himself. Patient was referred by Staci Acosta NP for mental health. Patient reports the following symptoms/concerns: He notes that he has been doing better this week compared to last week. He explains that once his paperwork is filled out and cleared for him to go back to work that he will be placed back on the schedule at [employer]. He describes experiencing depression in regards to the past and anxiety when it comes to the future. He reports that he stopped smoking and has lost some weight. He notes that since his dosage of Lithium was increased that he is no longer experiencing headaches anymore. He explains that he tends to experience more anxiety during the day rather than at night. He denies suicidal and homicidal thoughts.  Duration of problem: ; Severity of problem: moderate  OBJECTIVE: Mood: Euthymic and Affect: Appropriate Risk of harm to self or others: No plan to harm self or others  LIFE CONTEXT: Family and Social: see above. School/Work: see above. Self-Care: see above. Life Changes: see above.  GOALS ADDRESSED: Patient will: 1.  Reduce symptoms of: anxiety  2.  Increase knowledge and/or ability of: coping skills and self-management skills  3.  Demonstrate ability to: Increase healthy adjustment to current life circumstances  INTERVENTIONS: Interventions utilized:  Brief CBT was utilized by the clinician focusing on the patient's anxiety and affect on normal cognition. Clinician processed with the patient regarding how he has been doing since the last follow up session. Clinician explained to the patient that it seems like he has more of a positive attitude and healthy outlook on things this week  compared to last week. Clinician explained to the patient that she thinks that the more idle time he has own his hands is when he tends to dwell on and over think things. Clinician explained to the patient that she thinks it will be good for him to return back to work.  Standardized Assessments completed: GAD-7 and PHQ 9  ASSESSMENT: Patient currently experiencing see above.  Patient may benefit from see above.  PLAN: 1. Follow up with behavioral health clinician on : one week or earlier if needed. 2. Behavioral recommendations: see above.  3. Referral(s): [place] (In Clinic) 4. "From scale of 1-10, how likely are you to follow plan?":   Bayard Hugger, LCSW

## 2018-12-30 ENCOUNTER — Ambulatory Visit: Payer: Self-pay | Admitting: Licensed Clinical Social Worker

## 2018-12-30 ENCOUNTER — Other Ambulatory Visit: Payer: Self-pay

## 2018-12-30 DIAGNOSIS — F3162 Bipolar disorder, current episode mixed, moderate: Secondary | ICD-10-CM

## 2018-12-30 DIAGNOSIS — F410 Panic disorder [episodic paroxysmal anxiety] without agoraphobia: Secondary | ICD-10-CM

## 2018-12-30 DIAGNOSIS — F411 Generalized anxiety disorder: Secondary | ICD-10-CM

## 2018-12-30 MED ORDER — HYDROXYZINE HCL 25 MG PO TABS: 25.0000 mg | ORAL_TABLET | Freq: Three times a day (TID) | ORAL | 1 refills | Status: DC

## 2018-12-30 NOTE — BH Specialist Note (Signed)
Integrated Behavioral Health Follow Up Visit Via Phone  MRN: [id number] Name: Taylor Bryant  Type of Service: [place] Interpretor:No. Interpretor Name and Language: not applicable.   SUBJECTIVE: Taylor Bryant is a 38 y.o. male accompanied by herself. Patient was referred by Staci Acosta NP for mental health. Patient reports the following symptoms/concerns: He describes experiencing anxiety in the last week in regards to returning to work. He notes that he has felt a little bit down and depressed due to not being able to see his father for the holidays in the midst of COVID 19. He explains that he is utilizing coping skills, trying to stay positive, and practice self care. He denies suicidal and homicidal thoughts.  Duration of problem: ; Severity of problem: moderate  OBJECTIVE: Mood: Euthymic and Affect: Appropriate Risk of harm to self or others: No plan to harm self or others  LIFE CONTEXT: Family and Social: see above. School/Work: see above. Self-Care: see above. Life Changes: see above.  GOALS ADDRESSED: Patient will: 1.  Reduce symptoms of: anxiety  2.  Increase knowledge and/or ability of: self-management skills  3.  Demonstrate ability to: Increase healthy adjustment to current life circumstances  INTERVENTIONS: Interventions utilized:  Supportive Counseling was utilized by the clinician during today's follow up session. Clinician processed with the patent regarding how he has been doing since the last follow up session. Clinician encouraged the patient that he cannot control other people but can only work on controlling himself. Clinician encouraged the patient to practice self care and continue to utilize his coping skills.  Standardized Assessments completed: next session  ASSESSMENT: Patient currently experiencing see above.   Patient may benefit from see above.  PLAN: 1. Follow up with behavioral health clinician on : one week  or earlier if needed.  2. Behavioral recommendations: see above. 3. Referral(s): [facility] (In Clinic) 4. "From scale of 1-10, how likely are you to follow plan?":   Bayard Hugger, LCSW

## 2019-01-15 ENCOUNTER — Ambulatory Visit: Payer: BC Managed Care – PPO | Admitting: Licensed Clinical Social Worker

## 2019-01-15 ENCOUNTER — Other Ambulatory Visit: Payer: Self-pay

## 2019-01-15 DIAGNOSIS — F411 Generalized anxiety disorder: Secondary | ICD-10-CM

## 2019-01-15 DIAGNOSIS — F3162 Bipolar disorder, current episode mixed, moderate: Secondary | ICD-10-CM

## 2019-01-15 DIAGNOSIS — F410 Panic disorder [episodic paroxysmal anxiety] without agoraphobia: Secondary | ICD-10-CM

## 2019-01-15 NOTE — BH Specialist Note (Signed)
Integrated Behavioral Health Follow Up Visit Via Phone  MRN: [id number] Name: Taylor Bryant  Type of Service: Integrated Behavioral Health- Individual/Family Interpretor:No. Interpretor Name and Language: not applicable.  SUBJECTIVE: Taylor Bryant is a 38 y.o. male accompanied by himself. Patient was referred by [name] for mental health.  Patient reports the following symptoms/concerns: He describes feeling afraid about returning back to work because of potential negative reactions with employees and customers at work. He notes that he has not been able to sleep hardly at all since January 1st. He explains that he lacks motivation to do things. He notes that he turned in his return to work papers and has to call [employer] to talk to them about returning to work. He denies suicidal and homicidal thoughts.  Duration of problem: ; Severity of problem: moderate  OBJECTIVE: Mood: Euthymic and Affect: Appropriate Risk of harm to self or others: No plan to harm self or others  LIFE CONTEXT: Family and Social: see above. School/Work: see above. Self-Care: see above. Life Changes: see above.  GOALS ADDRESSED: Patient will: 1.  Reduce symptoms of: anxiety, depression and insomnia  2.  Increase knowledge and/or ability of: coping skills, healthy habits and self-management skills  3.  Demonstrate ability to: Increase healthy adjustment to current life circumstances  INTERVENTIONS: Interventions utilized:  Behavioral Activation was utilized by the clinician during today's follow up session. Clinician processed with the patient regarding how he has been doing since the last follow up session. Clinician discussed with the concept of behavioral activation, stressing the importance of keeping up with his routine, completing his hygiene, waking up around the same time each day, going to bed around the same time each night, and taking care of himself. Clinician discussed the concept of sleep  hygiene. Clinician explained to the patient that it sounds like if he were to go back to work, it would force him to keeping with his routine, needing the necessary distractions, and improve his overall mood plus symptoms of anxiety.  Standardized Assessments completed: GAD-7 and PHQ 9  ASSESSMENT: Patient currently experiencing see above.   Patient may benefit from see above.  PLAN: 1. Follow up with behavioral health clinician on : one week or earlier if needed.  2. Behavioral recommendations: see above.  3. Referral(s): [facility] (In Clinic) 4. "From scale of 1-10, how likely are you to follow plan?":  [name], LCSW

## 2019-01-22 ENCOUNTER — Ambulatory Visit: Payer: Self-pay

## 2019-01-22 ENCOUNTER — Other Ambulatory Visit: Payer: Self-pay

## 2019-01-22 ENCOUNTER — Ambulatory Visit: Payer: Self-pay | Admitting: Licensed Clinical Social Worker

## 2019-01-22 DIAGNOSIS — F410 Panic disorder [episodic paroxysmal anxiety] without agoraphobia: Secondary | ICD-10-CM

## 2019-01-22 DIAGNOSIS — F3162 Bipolar disorder, current episode mixed, moderate: Secondary | ICD-10-CM

## 2019-01-22 DIAGNOSIS — F411 Generalized anxiety disorder: Secondary | ICD-10-CM

## 2019-01-22 NOTE — BH Specialist Note (Signed)
Integrated Behavioral Health Follow Up Visit Via Phone  MRN: [id number] Name: Taylor Bryant  Type of Service: Integrated Behavioral Health- Individual/Family Interpretor:No. Interpretor Name and Language: not applicable.   SUBJECTIVE: Taylor Bryant is a 39 y.o. male accompanied by himself. Patient was referred by [name] for mental health. Patient reports the following symptoms/concerns: He describes experiencing insomnia. He explains that he has not been able to sleep at all. He notes that he has yet to return to work but is supposed to return on Sunday. He explains that he feels like he wants to cry but is unable to produce any tears. He explains that he wants to find the motivation to do what he needs to. He denies suicidal and homicidal thoughts.  Duration of problem: ; Severity of problem: moderate  OBJECTIVE: Mood: Euthymic and Affect: Appropriate Risk of harm to self or others: No plan to harm self or others  LIFE CONTEXT: Family and Social: see above. School/Work: see above. Self-Care: see above. Life Changes: see above.  GOALS ADDRESSED: Patient will: 1.  Reduce symptoms of: mood instability  2.  Increase knowledge and/or ability of: coping skills  3.  Demonstrate ability to: Increase healthy adjustment to current life circumstances  INTERVENTIONS: Interventions utilized:  Supportive Counseling was utilized by the clinician during today's follow up session. Clinician processed with the patient regarding how she has been doing since the last follow up session. Clinician utilized reflective listening encouraging the patient to ventilate towards his current situation. Clinician explained to the patient that she will have a case consultation with Dr. 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 Clinician explained to the patient that she thinks that part of  his problem is being out of routine and not taking care of himself properly.  Standardized Assessments completed: next session  ASSESSMENT: Patient currently experiencing see above.   Patient may benefit from see above.  PLAN: 1. Follow up with behavioral health clinician on : one week or earlier if needed.  2. Behavioral recommendations: see above.  3. Referral(s): [facility] (In Clinic) 4. "From scale of 1-10, how likely are you to follow plan?":   [name], LCSW

## 2019-01-29 ENCOUNTER — Other Ambulatory Visit: Payer: Self-pay

## 2019-01-29 ENCOUNTER — Ambulatory Visit: Payer: Self-pay | Admitting: Licensed Clinical Social Worker

## 2019-01-29 DIAGNOSIS — F411 Generalized anxiety disorder: Secondary | ICD-10-CM

## 2019-01-29 DIAGNOSIS — F3162 Bipolar disorder, current episode mixed, moderate: Secondary | ICD-10-CM

## 2019-01-29 DIAGNOSIS — F410 Panic disorder [episodic paroxysmal anxiety] without agoraphobia: Secondary | ICD-10-CM

## 2019-01-29 NOTE — BH Specialist Note (Signed)
Integrated Behavioral Health Follow Up Visit Via Phone  MRN: [id number] Name: Ulas Zuercher  Type of Service: Integrated Behavioral Health- Individual/Family Interpretor:No. Interpretor Name and Language: Not applicable.   SUBJECTIVE: Tudor Chandley is a 39 y.o. male accompanied by himself. Patient was referred by [name] for mental health.  Patient reports the following symptoms/concerns: He reports that he did start back to work at [employer] on Sunday. He explains that he has noticed that he has not been happy, feels uncomfortable, has a short fuse, and that his anxiety is keeping him from doing stuff. He notes that he is 100% sure that he does not have health insurance anymore. He denies suicidal and homicidal thoughts.  Duration of problem: ; Severity of problem: moderate  OBJECTIVE: Mood: Euthymic and Affect: Appropriate Risk of harm to self or others: No plan to harm self or others  LIFE CONTEXT: Family and Social: see above. School/Work: See above. Self-Care: see above. Life Changes: see above.   GOALS ADDRESSED: Patient will: 1.  Reduce symptoms of: anxiety  2.  Increase knowledge and/or ability of: coping skills and self-management skills  3.  Demonstrate ability to: Increase healthy adjustment to current life circumstances  INTERVENTIONS: Interventions utilized:  Brief CBT and Psychoeducation and/or Health Education was utilized by the clinician during today's follow up session. Clinician processed with the patient regarding how he has been doing since the last follow up session. Clinician explained to the patient that she had a case consultation with Dr. Mare Ferrari, MD psychiatric consultant who recommended that his dosage of Cymbalta be decreased from 90 mg to 60 mg due to belief that he has been having mixed episodes of mania. Clinician explained to the patient that she attempted to have the CMA send in this prescription but was told that he needs to update his proof of  income before his medication can be sent int.  Standardized Assessments completed: GAD-7 and PHQ 9  ASSESSMENT: Patient currently experiencing see above.   Patient may benefit from see above.  PLAN: 1. Follow up with behavioral health clinician on : see above. 2. Behavioral recommendations:  See above. 3. Referral(s): [facility] (In Clinic) 4. "From scale of 1-10, how likely are you to follow plan?":   [name], LCSW

## 2019-02-05 ENCOUNTER — Telehealth: Payer: Self-pay

## 2019-02-05 ENCOUNTER — Ambulatory Visit: Payer: Self-pay | Admitting: Licensed Clinical Social Worker

## 2019-02-05 ENCOUNTER — Other Ambulatory Visit: Payer: Self-pay

## 2019-02-05 DIAGNOSIS — G540 Brachial plexus disorders: Secondary | ICD-10-CM | POA: Insufficient documentation

## 2019-02-05 DIAGNOSIS — F3162 Bipolar disorder, current episode mixed, moderate: Secondary | ICD-10-CM

## 2019-02-05 DIAGNOSIS — F411 Generalized anxiety disorder: Secondary | ICD-10-CM

## 2019-02-05 DIAGNOSIS — F410 Panic disorder [episodic paroxysmal anxiety] without agoraphobia: Secondary | ICD-10-CM

## 2019-02-05 NOTE — Telephone Encounter (Signed)
Called patient and left voicemail.   Patient has been reminded numerous times that he needs to provide proof of income and letter stating insurance has been cancelled. He told LCSW that he would bring in to clinic on 01/29/19. We still have not received information.  All future appointments have been cancelled. Patient will need to provide POI to continue services provided at [facility].

## 2019-02-05 NOTE — BH Specialist Note (Signed)
Integrated Behavioral Health Follow Up Visit Via Phone  MRN: [id number] Name: Taylor Bryant  Type of Service: Integrated Behavioral Health- Individual/Family Interpretor:No. Interpretor Name and Language:   SUBJECTIVE: Taylor Bryant is a 39 y.o. male accompanied by himself. Patient was referred by [name] for mental health.  Patient reports the following symptoms/concerns: He notes that he is doing much better since the last follow up session. He notes that since the dosage of Cymbalta was decreased from 90 to 60 mg that he is not shaking as much and is a little bit more relaxed. He explains that the only issue that he has is that he is not sleeping. He explains that he is only working four hour shifts instead of eight hour shifts due to his manager wanting to make sure he can handle things. He notes that he is praying a lot, listening to [place] music, is no longer smoking cigarettes, and eating better. He denies suicidal and homicidal thoughts.  Duration of problem: ; Severity of problem: moderate  OBJECTIVE: Mood: Euthymic and Affect: Appropriate Risk of harm to self or others: No plan to harm self or others  LIFE CONTEXT: Family and Social: see above. School/Work: see above. Self-Care: see above. Life Changes: see above.  GOALS ADDRESSED: Patient will: 1.  Reduce symptoms of: anxiety and insomnia  2.  Increase knowledge and/or ability of: coping skills and self-management skills  3.  Demonstrate ability to: Increase healthy adjustment to current life circumstances  INTERVENTIONS: Interventions utilized:  Supportive Counseling was utilized by the clinician during today's follow up session. Clinician processed with the patient regarding how he has been doing since the last follow up session. Clinician explained to the patient that it sounds like his spirits have been lifted since he has been gradually easing back into work. Clinician encouraged the patient to keep up his  healthy habits and utilize his coping skills.  Standardized Assessments completed: next session  ASSESSMENT: Patient currently experiencing  see above.  Patient may benefit from see above.  PLAN: 1. Follow up with behavioral health clinician on : one week or earlier if needed. 2. Behavioral recommendations: see above. 3. Referral(s): [facility] (In Clinic) 4. "From scale of 1-10, how likely are you to follow plan?":   [name], LCSW

## 2019-02-12 ENCOUNTER — Ambulatory Visit: Payer: BC Managed Care – PPO | Admitting: Licensed Clinical Social Worker

## 2019-02-18 ENCOUNTER — Other Ambulatory Visit: Payer: BC Managed Care – PPO

## 2019-02-19 ENCOUNTER — Ambulatory Visit: Payer: BC Managed Care – PPO | Admitting: Licensed Clinical Social Worker

## 2019-02-26 ENCOUNTER — Ambulatory Visit: Payer: BC Managed Care – PPO | Admitting: Licensed Clinical Social Worker

## 2019-03-05 ENCOUNTER — Telehealth: Payer: Self-pay | Admitting: Gerontology

## 2019-03-05 NOTE — Telephone Encounter (Signed)
Pt was wondering why he could not be seen, we informed him he still has active insurance and that we could not see him due to this.

## 2019-04-03 ENCOUNTER — Ambulatory Visit: Payer: BC Managed Care – PPO | Attending: Internal Medicine

## 2019-04-03 DIAGNOSIS — Z23 Encounter for immunization: Secondary | ICD-10-CM

## 2019-04-03 NOTE — Progress Notes (Signed)
   Covid-19 Vaccination Clinic  Name:  Rayne Loiseau    MRN: [id number] DOB: Mar 22, 1980  04/03/2019  Mr. Carcione was observed post Covid-19 immunization for 30 minutes based on pre-vaccination screening without incident. He was provided with Vaccine Information Sheet and instruction to access the V-Safe system.   Mr. Sessums was instructed to call 911 with any severe reactions post vaccine: Marland Kitchen Difficulty breathing  . Swelling of face and throat  . A fast heartbeat  . A bad rash all over body  . Dizziness and weakness   Immunizations Administered    Name Date Dose VIS Date Route   Pfizer COVID-19 Vaccine 04/03/2019  8:46 AM 0.3 mL 12/19/2018 Intramuscular   Manufacturer: [employer], [employer]   Lot: [id number]   NDC: [address]-2

## 2019-04-24 ENCOUNTER — Ambulatory Visit: Payer: BC Managed Care – PPO | Admitting: Urology

## 2019-04-27 ENCOUNTER — Ambulatory Visit: Payer: BC Managed Care – PPO | Attending: Internal Medicine

## 2019-04-27 DIAGNOSIS — Z23 Encounter for immunization: Secondary | ICD-10-CM

## 2019-04-27 NOTE — Progress Notes (Signed)
   Covid-19 Vaccination Clinic  Name:  Korrey Schleicher    MRN: [id number] DOB: May 03, 1980  04/27/2019  Mr. Cirelli was observed post Covid-19 immunization for 15 minutes without incident. He was provided with Vaccine Information Sheet and instruction to access the V-Safe system.   Mr. Ardis was instructed to call 911 with any severe reactions post vaccine: Marland Kitchen Difficulty breathing  . Swelling of face and throat  . A fast heartbeat  . A bad rash all over body  . Dizziness and weakness   Immunizations Administered    Name Date Dose VIS Date Route   Pfizer COVID-19 Vaccine 04/27/2019  2:04 PM 0.3 mL 03/04/2018 Intramuscular   Manufacturer: [employer], [employer]   Lot: [id number]   NDC: [address]-8

## 2019-05-08 ENCOUNTER — Encounter: Payer: Self-pay | Admitting: Urology

## 2019-05-08 ENCOUNTER — Ambulatory Visit: Payer: BC Managed Care – PPO | Admitting: Urology

## 2019-05-08 ENCOUNTER — Other Ambulatory Visit: Payer: Self-pay

## 2019-05-08 VITALS — BP 142/86 | HR 79 | Ht 70.0 in | Wt 209.0 lb

## 2019-05-08 DIAGNOSIS — N50812 Left testicular pain: Secondary | ICD-10-CM | POA: Diagnosis not present

## 2019-05-08 DIAGNOSIS — N529 Male erectile dysfunction, unspecified: Secondary | ICD-10-CM | POA: Diagnosis not present

## 2019-05-08 DIAGNOSIS — N50811 Right testicular pain: Secondary | ICD-10-CM

## 2019-05-08 DIAGNOSIS — R350 Frequency of micturition: Secondary | ICD-10-CM | POA: Diagnosis not present

## 2019-05-08 MED ORDER — TADALAFIL 5 MG PO TABS: 5.0000 mg | ORAL_TABLET | Freq: Every day | ORAL | 11 refills | Status: AC | PRN

## 2019-05-08 NOTE — Patient Instructions (Signed)
Tadalafil tablets (Cialis) What is this medicine? TADALAFIL (tah DA la fil) is used to treat erection problems in men. It is also used for enlargement of the prostate gland in men, a condition called benign prostatic hyperplasia or BPH. This medicine improves urine flow and reduces BPH symptoms. This medicine can also treat both erection problems and BPH when they occur together. This medicine may be used for other purposes; ask your health care provider or pharmacist if you have questions. COMMON BRAND NAME(S): Adcirca, ALYQ, Cialis What should I tell my health care provider before I take this medicine? They need to know if you have any of these conditions:  bleeding disorders  eye or vision problems, including a rare inherited eye disease called retinitis pigmentosa  anatomical deformation of the penis, Peyronie's disease, or history of priapism (painful and prolonged erection)  heart disease, angina, a history of heart attack, irregular heart beats, or other heart problems  high or low blood pressure  history of blood diseases, like sickle cell anemia or leukemia  history of stomach bleeding  kidney disease  liver disease  stroke  an unusual or allergic reaction to tadalafil, other medicines, foods, dyes, or preservatives  pregnant or trying to get pregnant  breast-feeding How should I use this medicine? Take this medicine by mouth with a glass of water. Follow the directions on the prescription label. You may take this medicine with or without meals. When this medicine is used for erection problems, your doctor may prescribe it to be taken once daily or as needed. If you are taking the medicine as needed, you may be able to have sexual activity 30 minutes after taking it and for up to 36 hours after taking it. Whether you are taking the medicine as needed or once daily, you should not take more than one dose per day. If you are taking this medicine for symptoms of benign  prostatic hyperplasia (BPH) or to treat both BPH and an erection problem, take the dose once daily at about the same time each day. Do not take your medicine more often than directed. Talk to your pediatrician regarding the use of this medicine in children. Special care may be needed. Overdosage: If you think you have taken too much of this medicine contact a poison control center or emergency room at once. NOTE: This medicine is only for you. Do not share this medicine with others. What if I miss a dose? If you are taking this medicine as needed for erection problems, this does not apply. If you miss a dose while taking this medicine once daily for an erection problem, benign prostatic hyperplasia, or both, take it as soon as you remember, but do not take more than one dose per day. What may interact with this medicine? Do not take this medicine with any of the following medications:  nitrates like amyl nitrite, isosorbide dinitrate, isosorbide mononitrate, nitroglycerin  other medicines for erectile dysfunction like avanafil, sildenafil, vardenafil  other tadalafil products (Adcirca)  riociguat This medicine may also interact with the following medications:  certain drugs for high blood pressure  certain drugs for the treatment of HIV infection or AIDS  certain drugs used for fungal or yeast infections, like fluconazole, itraconazole, ketoconazole, and voriconazole  certain drugs used for seizures like carbamazepine, phenytoin, and phenobarbital  grapefruit juice  macrolide antibiotics like clarithromycin, erythromycin, troleandomycin  medicines for prostate problems  rifabutin, rifampin or rifapentine This list may not describe all possible interactions. Give your   health care provider a list of all the medicines, herbs, non-prescription drugs, or dietary supplements you use. Also tell them if you smoke, drink alcohol, or use illegal drugs. Some items may interact with your  medicine. What should I watch for while using this medicine? If you notice any changes in your vision while taking this drug, call your doctor or health care professional as soon as possible. Stop using this medicine and call your health care provider right away if you have a loss of sight in one or both eyes. Contact your doctor or health care professional right away if the erection lasts longer than 4 hours or if it becomes painful. This may be a sign of serious problem and must be treated right away to prevent permanent damage. If you experience symptoms of nausea, dizziness, chest pain or arm pain upon initiation of sexual activity after taking this medicine, you should refrain from further activity and call your doctor or health care professional as soon as possible. Do not drink alcohol to excess (examples, 5 glasses of wine or 5 shots of whiskey) when taking this medicine. When taken in excess, alcohol can increase your chances of getting a headache or getting dizzy, increasing your heart rate or lowering your blood pressure. Using this medicine does not protect you or your partner against HIV infection (the virus that causes AIDS) or other sexually transmitted diseases. What side effects may I notice from receiving this medicine? Side effects that you should report to your doctor or health care professional as soon as possible:  allergic reactions like skin rash, itching or hives, swelling of the face, lips, or tongue  breathing problems  changes in hearing  changes in vision  chest pain  fast, irregular heartbeat  prolonged or painful erection  seizures Side effects that usually do not require medical attention (report to your doctor or health care professional if they continue or are bothersome):  back pain  dizziness  flushing  headache  indigestion  muscle aches  nausea  stuffy or runny nose This list may not describe all possible side effects. Call your doctor  for medical advice about side effects. You may report side effects to FDA at 1-800-FDA-1088. Where should I keep my medicine? Keep out of the reach of children. Store at room temperature between 15 and 30 degrees C (59 and 86 degrees F). Throw away any unused medicine after the expiration date. NOTE: This sheet is a summary. It may not cover all possible information. If you have questions about this medicine, talk to your doctor, pharmacist, or health care provider.  2020 Elsevier/Gold Standard (2013-05-15 13:15:49)  

## 2019-05-08 NOTE — Progress Notes (Signed)
05/08/2019 10:27 AM   Taylor Bryant [birth year] [id number]  Referring provider: Langston Reusing, NP [place],  [place] [address]  Chief Complaint  Patient presents with  . Follow-up    HPI:  1) frequency - He is taking cymbalta. This also caused difficulty urinating. He noticed frequency and hesitancy. His libido is low and he has trouble getting an erection. All his tests - "HIV, gonnorhea" were normal. He is now at [facility], but stops periodically if A/D flare up. He is drinking plenty of water but a lot soda. He has a 42 year old child. Tried daily tadalafil with good results. Drinking more water. Stopped etoh.  2) ED - He recalls getting shots for "bipolar" disorder at about 2015. He recalls a period of decreased libido but then started a relationship. He then noted blood in the ejaculate Sept 2019. All tests were "fine" . Now after ejaculation he get some discomfort. He took a "medicine" from the "gas station" and had a bad HA. He had something wired years ago in the right testicle. It feels "weird".  Better erections with tadalafil as above.   Taylor Bryant returns and continues daily tadalafil. He still has some frequency and urgency. Nothing bothersome. He corrected some lithium levels and overall things seem better. He continues to have some testicle pain on both sides and felt something on the side of the testicle. His Cr was 0.87 in Nov 2020 with PCP - he showed me on his phone. We discussed this is a good normal creatinine.    PMH: Past Medical History:  Diagnosis Date  . Bicuspid aortic valve   . Heart murmur   . Hyperlipidemia   . Schizoaffective disorder ([place])   . Von Willebrand disease [facility])     Surgical History: Past Surgical History:  Procedure Laterality Date  . TOOTH EXTRACTION      Home Medications:  Allergies as of 05/08/2019      Reactions   Penicillins Rash      Medication List       Accurate as of May 08, 2019 10:27 AM.  If you have any questions, ask your nurse or doctor.        atorvastatin 40 MG tablet Commonly known as: LIPITOR Take 1 tablet (40 mg total) by mouth daily at 6 PM.   buPROPion 150 MG 24 hr tablet Commonly known as: WELLBUTRIN XL Take 150 mg by mouth daily.   Claritin 10 MG tablet Generic drug: loratadine Claritin 10 mg tablet  Take 1 tablet every day by oral route.   clonazePAM 0.5 MG tablet Commonly known as: KLONOPIN Take 0.5 mg by mouth 2 (two) times daily as needed for anxiety.   DULoxetine 30 MG capsule Commonly known as: CYMBALTA Take 3 capsules (90 mg total) by mouth daily.   famotidine 20 MG tablet Commonly known as: PEPCID famotidine 20 mg tablet  Take 1 tablet every day by oral route.   hydrOXYzine 25 MG tablet Commonly known as: ATARAX/VISTARIL Take 1 tablet (25 mg total) by mouth 3 (three) times daily.   lithium carbonate 450 MG CR tablet Commonly known as: ESKALITH Take 450mg  BID and 900mg  QHS   ondansetron 4 MG tablet Commonly known as: Zofran Take 1 tablet (4 mg total) by mouth daily as needed for nausea or vomiting.   tadalafil 5 MG tablet Commonly known as: CIALIS Take 1 tablet (5 mg total) by mouth daily as needed for up to 30  days for erectile dysfunction.   verapamil 120 MG CR tablet Commonly known as: CALAN-SR Take 1 tablet (120 mg total) by mouth at bedtime.       Allergies:  Allergies  Allergen Reactions  . Penicillins Rash    Family History: Family History  Problem Relation Age of Onset  . Heart Problems Father     Social History:  reports that he quit smoking about 5 years ago. His smoking use included cigarettes. He has a 4.00 pack-year smoking history. He has never used smokeless tobacco. He reports previous alcohol use. He reports previous drug use.   Physical Exam: BP (!) 142/86   Pulse 79   Ht 5\' 10"  (1.778 m)   Wt 209 lb (94.8 kg)   BMI 29.99 kg/m   Constitutional:  Alert and oriented, No acute distress.  HEENT: [place] AT, moist mucus membranes.  Trachea midline, no masses. Cardiovascular: No clubbing, cyanosis, or edema. Respiratory: Normal respiratory effort, no increased work of breathing. GI: Abdomen is soft, nontender, nondistended, no abdominal masses GU: No CVA tenderness; GU: Penis uncircumcised, normal foreskin, no phimosis, testicles descended bilaterally and palpably normal, bilateral epididymis palpably normal, scrotum normal Skin: No rashes, bruises or suspicious lesions. Neurologic: Grossly intact, no focal deficits, moving all 4 extremities. Psychiatric: Normal mood and affect.  Laboratory Data: Lab Results  Component Value Date   WBC 9.1 12/06/2018   HGB 15.1 12/06/2018   HCT 44.3 12/06/2018   MCV 93.3 12/06/2018   PLT 321 12/06/2018    Lab Results  Component Value Date   CREATININE 0.87 12/06/2018    No results found for: PSA  No results found for: TESTOSTERONE  Lab Results  Component Value Date   HGBA1C 5.6 09/10/2017    Urinalysis    Component Value Date/Time   COLORURINE YELLOW (A) 12/06/2018 2012   APPEARANCEUR CLEAR (A) 12/06/2018 2012   APPEARANCEUR Clear 06/26/2018 1733   LABSPEC 1.021 12/06/2018 2012   PHURINE 6.0 12/06/2018 2012   GLUCOSEU NEGATIVE 12/06/2018 2012   HGBUR NEGATIVE 12/06/2018 2012   BILIRUBINUR NEGATIVE 12/06/2018 2012   BILIRUBINUR Negative 06/26/2018 1733   KETONESUR NEGATIVE 12/06/2018 2012   PROTEINUR NEGATIVE 12/06/2018 2012   NITRITE NEGATIVE 12/06/2018 2012   LEUKOCYTESUR NEGATIVE 12/06/2018 2012    Lab Results  Component Value Date   LABMICR See below: 06/18/2018   WBCUA 0-5 06/18/2018   LABEPIT 0-10 06/18/2018   BACTERIA NONE SEEN 12/06/2018    Pertinent Imaging: n/a No results found for this or any previous visit. No results found for this or any previous visit. No results found for this or any previous visit. No results found for this or any previous visit. No results found for this or any previous  visit. No results found for this or any previous visit. No results found for this or any previous visit. No results found for this or any previous visit.  Assessment & Plan:    Frequency, urgency - stable on daily tadalafil . Refilled.   ED - stable as above   Testicle pain- benign exam, he was reassured   No follow-ups on file.  12/08/2018, MD  [facility] [address] [place], [place] [phone]

## 2019-05-18 ENCOUNTER — Ambulatory Visit (INDEPENDENT_AMBULATORY_CARE_PROVIDER_SITE_OTHER)
Admission: RE | Admit: 2019-05-18 | Discharge: 2019-05-18 | Disposition: A | Discharge status: Home / Self Care | Payer: BC Managed Care – PPO | Source: Ambulatory Visit

## 2019-05-18 DIAGNOSIS — M546 Pain in thoracic spine: Secondary | ICD-10-CM

## 2019-05-18 MED ORDER — TIZANIDINE HCL 2 MG PO TABS: 2.0000 mg | ORAL_TABLET | Freq: Three times a day (TID) | ORAL | 0 refills | Status: DC | PRN

## 2019-05-18 NOTE — Discharge Instructions (Addendum)
Tylenol 1000mg  three times a day for pain. Tizanidine as needed, this can make you drowsy, so do not take if you are going to drive, operate heavy machinery, or make important decisions. Follow up with PCP if symptoms not improving. If loss of grip strength, go to the ED for further evaluation.  If having shortness of breath, chest pain, passing out, go to the ED for further evaluation. Otherwise, follow up with cardiology as scheduled for further evaluation and management needed.

## 2019-05-18 NOTE — ED Provider Notes (Signed)
Virtual Visit via Video Note:  Taylor Bryant  initiated request for Telemedicine visit with [facility] team. I connected with [name]  on 05/18/2019 at 12:34 PM  for a synchronized telemedicine visit using a video enabled HIPPA compliant telemedicine application. I verified that I am speaking with [name]  using two identifiers. Jacody Beneke Doree Fudge, PA-C  was physically located in a [facility] Urgent care site and Hubbert Landrigan was located at a different location.   The limitations of evaluation and management by telemedicine as well as the availability of in-person appointments were discussed. Patient was informed that he  may incur a bill ( including co-pay) for this virtual visit encounter. Madoc Holquin  expressed understanding and gave verbal consent to proceed with virtual visit.     History of Present Illness:[name]  is a 39 y.o. male presents with 2 day history of thoracic back pain. States due to history of thoracic outlet syndrome, try not to do heavy lifting. However, had to do heavy lifting last night at work, and now with back pain. No numbness/tingling, loss of grip strength. While doing the heavy lifting, had 2 episodes of lightheadedness with straining, history of bucuspid aortic valve, and worries for this. This has resolved. Walks on treadmill regularly, and have not had any exertional fatigue, exertional chest pain, shortness of breath. Have not had syncopal episodes.   Past Medical History:  Diagnosis Date  . Bicuspid aortic valve   . Heart murmur   . Hyperlipidemia   . Schizoaffective disorder (HCC)   . Von Willebrand disease (HCC)     Allergies  Allergen Reactions  . Penicillins Rash        Observations/Objective: General: Well appearing, nontoxic, no acute distress. Sitting comfortably. Head: Normocephalic, atraumatic Eye: No conjunctival injection, eyelid swelling. EOMI ENT: Mucus membranes moist, no lip cracking. No obvious nasal drainage. Pulm: Speaking in full  sentences without difficulty. Normal effort. No respiratory distress, accessory muscle use. MSK: full ROM of neck, shoulder, back. Neuro: Normal mental status. Alert and oriented x 3. Able to ambulate on own without difficulty. No obvious changes in gait.    Assessment and Plan: Will treat symptomatically for the back pain for now. Will provide note for light duty for the next few days. Follow up with PCP/orthopedics if symptoms not improving. Patient already with cardiology, follow up as scheduled for further evaluation of current symptoms. Strict return precautions given. Patient expresses understanding and agrees to plan.  Follow Up Instructions:    I discussed the assessment and treatment plan with the patient. The patient was provided an opportunity to ask questions and all were answered. The patient agreed with the plan and demonstrated an understanding of the instructions.   The patient was advised to call back or seek an in-person evaluation if the symptoms worsen or if the condition fails to improve as anticipated.  I provided 15 minutes of non-face-to-face time during this encounter.    Belinda Fisher, PA-C  05/18/2019 12:34 PM         Belinda Fisher, PA-C 05/18/19 1414

## 2019-05-20 ENCOUNTER — Encounter: Payer: Self-pay | Admitting: Internal Medicine

## 2019-05-20 ENCOUNTER — Other Ambulatory Visit: Payer: Self-pay | Admitting: Internal Medicine

## 2019-05-26 ENCOUNTER — Encounter: Payer: Self-pay | Admitting: Internal Medicine

## 2019-05-26 ENCOUNTER — Encounter: Payer: BC Managed Care – PPO | Admitting: Internal Medicine

## 2019-06-11 ENCOUNTER — Ambulatory Visit (INDEPENDENT_AMBULATORY_CARE_PROVIDER_SITE_OTHER)
Admission: RE | Admit: 2019-06-11 | Discharge: 2019-06-11 | Disposition: A | Discharge status: Home / Self Care | Payer: BC Managed Care – PPO | Source: Ambulatory Visit

## 2019-06-11 DIAGNOSIS — J069 Acute upper respiratory infection, unspecified: Secondary | ICD-10-CM

## 2019-06-11 DIAGNOSIS — Z20822 Contact with and (suspected) exposure to covid-19: Secondary | ICD-10-CM

## 2019-06-11 HISTORY — DX: Panic disorder (episodic paroxysmal anxiety): F41.0

## 2019-06-11 MED ORDER — PREDNISONE 20 MG PO TABS: 20.0000 mg | ORAL_TABLET | Freq: Two times a day (BID) | ORAL | 0 refills | Status: AC

## 2019-06-11 MED ORDER — FLUTICASONE PROPIONATE 50 MCG/ACT NA SUSP: 2.0000 | Freq: Every day | NASAL | 0 refills | Status: AC

## 2019-06-11 MED ORDER — BENZONATATE 100 MG PO CAPS: 100.0000 mg | ORAL_CAPSULE | Freq: Three times a day (TID) | ORAL | 0 refills | Status: DC

## 2019-06-11 MED ORDER — CETIRIZINE HCL 10 MG PO TABS: 10.0000 mg | ORAL_TABLET | Freq: Every day | ORAL | 0 refills | Status: DC

## 2019-06-11 NOTE — ED Provider Notes (Signed)
[place] Virtual Visit via Video Note:  Taylor Bryant  initiated request for Telemedicine visit with [facility] Urgent Care team. I connected with Taylor Bryant  on 06/11/2019 at 12:07 PM  for a synchronized telemedicine visit using a video enabled HIPPA compliant telemedicine application. I verified that I am speaking with [facility]  using two identifiers. Taylor Box, PA-C  was physically located in a [place] Urgent care site and Taylor Bryant was located at a different location.   The limitations of evaluation and management by telemedicine as well as the availability of in-person appointments were discussed. Patient was informed that he  may incur a bill ( including co-pay) for this virtual visit encounter. Taylor Bryant  expressed understanding and gave verbal consent to proceed with virtual visit.   [id number] 06/11/19 Arrival Time: 8841  CC: URI symptoms   SUBJECTIVE: History from: patient.  Taylor Bryant is a 39 y.o. male who presents with persistent dry cough that has become productive with clear phlegm, runny nose, congestion, sore throat, and fatigue x 2 weeks, body aches, nausea, and decreased appetite x 2 days.  Denies sick exposure or precipitating event.  Does admit to recent travel to [place] this past weekend.  Had a send out COVID test today done at [facility].  Has tried OTC medications without relief.  Symptoms are made worse with laying down at night.  Denies previous symptoms in the past.   Denies fever, chills, SOB, wheezing, chest pain, vomiting, changes in bowel or bladder habits.    Received both pfizer vaccines  ROS: As per HPI.  All other pertinent ROS negative.     Past Medical History:  Diagnosis Date  . Bicuspid aortic valve   . [place] murmur   . Hyperlipidemia   . Panic attacks   . Schizoaffective disorder ([place])   . Von Willebrand disease ([place])    Past Surgical History:  Procedure Laterality Date  . TOOTH EXTRACTION     Allergies  Allergen  Reactions  . Penicillins Rash   No current facility-administered medications on file prior to encounter.   Current Outpatient Medications on File Prior to Encounter  Medication Sig Dispense Refill  . buPROPion (WELLBUTRIN XL) 150 MG 24 hr tablet Take 150 mg by mouth daily.    . clonazePAM (KLONOPIN) 0.5 MG tablet Take 0.5 mg by mouth 2 (two) times daily as needed for anxiety.    Marland Kitchen loratadine (CLARITIN) 10 MG tablet Claritin 10 mg tablet  Take 1 tablet every day by oral route.    . ondansetron (ZOFRAN) 4 MG tablet Take 1 tablet (4 mg total) by mouth daily as needed for nausea or vomiting. 8 tablet 0  . tadalafil (CIALIS) 5 MG tablet Take 1 tablet (5 mg total) by mouth daily as needed for erectile dysfunction. 30 tablet 11    OBJECTIVE:   There were no vitals filed for this visit.  General appearance: alert; no distress Eyes: EOMI grossly HENT: normocephalic; atraumatic Neck: supple with FROM Lungs: normal respiratory effort; speaking in full sentences without difficulty; mild cough Extremities: moves extremities without difficulty Skin: No obvious rashes Neurologic: No facial asymmetries Psychological: alert and cooperative; normal mood and affect  ASSESSMENT & PLAN:  1. Viral URI with cough   2. Suspected COVID-19 virus infection     Meds ordered this encounter  Medications  . cetirizine (ZYRTEC) 10 MG tablet    Sig: Take 1 tablet (10 mg total) by mouth daily.  Dispense:  30 tablet    Refill:  0    Order Specific Question:   Supervising Provider    Answer:   Eustace Moore [[id number]]  . fluticasone (FLONASE) 50 MCG/ACT nasal spray    Sig: Place 2 sprays into both nostrils daily.    Dispense:  16 g    Refill:  0    Order Specific Question:   Supervising Provider    Answer:   Eustace Moore [[id number]]  . benzonatate (TESSALON) 100 MG capsule    Sig: Take 1 capsule (100 mg total) by mouth every 8 (eight) hours.    Dispense:  21 capsule    Refill:  0    Order  Specific Question:   Supervising Provider    Answer:   Eustace Moore [[id number]]  . predniSONE (DELTASONE) 20 MG tablet    Sig: Take 1 tablet (20 mg total) by mouth 2 (two) times daily with a meal for 5 days.    Dispense:  10 tablet    Refill:  0    Order Specific Question:   Supervising Provider    Answer:   Eustace Moore [[id number]]    COVID testing pending  In the meantime: You should remain isolated in your home for 10 days from symptom onset AND greater than 72 hours after symptoms resolution (absence of fever without the use of fever-reducing medication and improvement in respiratory symptoms), whichever is longer Get plenty of rest and push fluids Prednisone prescribed for cough Tessalon perles prescribed for cough zyrtec for nasal congestion, runny nose, and/or sore throat flonase as needed for nasal congestion and runny nose Use medications daily for symptom relief Use OTC medications like ibuprofen or tylenol as needed fever or pain Follow up in person OR go to the ED if you have any new or worsening symptoms such as fever, cough, shortness of breath, chest tightness, chest pain, turning blue, changes in mental status, etc...   I discussed the assessment and treatment plan with the patient. The patient was provided an opportunity to ask questions and all were answered. The patient agreed with the plan and demonstrated an understanding of the instructions.   The patient was advised to call back or seek an in-person evaluation if the symptoms worsen or if the condition fails to improve as anticipated.  I provided 11 minutes of non-face-to-face time during this encounter.  Rennis Harding, PA-C  06/11/2019 12:07 PM          Rennis Harding, PA-C 06/11/19 1209

## 2019-06-11 NOTE — Discharge Instructions (Signed)
COVID testing pending  In the meantime: You should remain isolated in your home for 10 days from symptom onset AND greater than 72 hours after symptoms resolution (absence of fever without the use of fever-reducing medication and improvement in respiratory symptoms), whichever is longer Get plenty of rest and push fluids Prednisone prescribed for cough Tessalon perles prescribed for cough zyrtec for nasal congestion, runny nose, and/or sore throat flonase as needed for nasal congestion and runny nose Use medications daily for symptom relief Use OTC medications like ibuprofen or tylenol as needed fever or pain Follow up in person OR go to the ED if you have any new or worsening symptoms such as fever, cough, shortness of breath, chest tightness, chest pain, turning blue, changes in mental status, etc..Marland Kitchen

## 2019-06-13 ENCOUNTER — Telehealth: Payer: BC Managed Care – PPO

## 2019-06-13 ENCOUNTER — Other Ambulatory Visit: Payer: Self-pay

## 2019-06-13 ENCOUNTER — Ambulatory Visit
Admission: EM | Admit: 2019-06-13 | Discharge: 2019-06-13 | Disposition: A | Discharge status: Home / Self Care | Payer: BC Managed Care – PPO | Attending: Family Medicine | Admitting: Family Medicine

## 2019-06-13 ENCOUNTER — Ambulatory Visit (INDEPENDENT_AMBULATORY_CARE_PROVIDER_SITE_OTHER): Payer: BC Managed Care – PPO

## 2019-06-13 DIAGNOSIS — R059 Cough, unspecified: Secondary | ICD-10-CM

## 2019-06-13 DIAGNOSIS — J0100 Acute maxillary sinusitis, unspecified: Secondary | ICD-10-CM

## 2019-06-13 DIAGNOSIS — R05 Cough: Secondary | ICD-10-CM

## 2019-06-13 MED ORDER — ALBUTEROL SULFATE HFA 108 (90 BASE) MCG/ACT IN AERS: 1.0000 | INHALATION_SPRAY | Freq: Four times a day (QID) | RESPIRATORY_TRACT | 0 refills | Status: DC | PRN

## 2019-06-13 MED ORDER — HYDROCOD POLST-CPM POLST ER 10-8 MG/5ML PO SUER: 5.0000 mL | Freq: Every evening | ORAL | 0 refills | Status: DC | PRN

## 2019-06-13 MED ORDER — DOXYCYCLINE HYCLATE 100 MG PO TABS: 100.0000 mg | ORAL_TABLET | Freq: Two times a day (BID) | ORAL | 0 refills | Status: DC

## 2019-06-13 NOTE — Discharge Instructions (Signed)
Flonase nasal spray °

## 2019-06-13 NOTE — ED Provider Notes (Signed)
[facility]    CSN: [id number] Arrival date & time: 06/13/19  1239      History   Chief Complaint Chief Complaint  Patient presents with  . Cough    HPI Taylor Bryant is a 39 y.o. male.   39 yo male with a c/o cough, congestion, chest tightness, wheezing, sinus congestion/headaches, ear pain for the past 2-3 weeks. Has been tested negative for covid twice. States he was prescribed prednisone and tessalon with minimal relief. Denies chest pains, fevers, chills.    Cough   Past Medical History:  Diagnosis Date  . Bicuspid aortic valve   . Heart murmur   . Hyperlipidemia   . Panic attacks   . Schizoaffective disorder (HCC)   . Von Willebrand disease [facility])     Patient Active Problem List   Diagnosis Date Noted  . Thoracic outlet syndrome 02/05/2019  . Chronic headache 09/25/2018  . Hypocalcemia 09/25/2018  . Fatigue 09/25/2018  . Hyperlipidemia 07/10/2018  . Generalized anxiety disorder 07/10/2018  . Bipolar disorder, current episode mixed, moderate (HCC) 05/01/2018  . Bicuspid aortic valve 12/10/2017  . Von Willebrand disease (HCC) 09/26/2017  . Asthma 09/17/2014    Past Surgical History:  Procedure Laterality Date  . TOOTH EXTRACTION         Home Medications    Prior to Admission medications   Medication Sig Start Date End Date Taking? Authorizing Provider  benzonatate (TESSALON) 100 MG capsule Take 1 capsule (100 mg total) by mouth every 8 (eight) hours. 06/11/19  Yes Wurst, [place], PA-C  buPROPion (WELLBUTRIN XL) 150 MG 24 hr tablet Take 150 mg by mouth daily.   Yes [provider]  clonazePAM (KLONOPIN) 0.5 MG tablet Take 0.5 mg by mouth 2 (two) times daily as needed for anxiety.   Yes [provider]  loratadine (CLARITIN) 10 MG tablet Claritin 10 mg tablet  Take 1 tablet every day by oral route.   Yes [provider]  predniSONE (DELTASONE) 20 MG tablet Take 1 tablet (20 mg total) by mouth 2 (two) times daily with  a meal for 5 days. 06/11/19 06/16/19 Yes Wurst, [place], PA-C  albuterol (VENTOLIN HFA) 108 (90 Base) MCG/ACT inhaler Inhale 1-2 puffs into the lungs every 6 (six) hours as needed for wheezing or shortness of breath. 06/13/19   Payton Mccallum, MD  cetirizine (ZYRTEC) 10 MG tablet Take 1 tablet (10 mg total) by mouth daily. 06/11/19   Wurst, [place], PA-C  chlorpheniramine-HYDROcodone (TUSSIONEX PENNKINETIC ER) 10-8 MG/5ML SUER Take 5 mLs by mouth at bedtime as needed. 06/13/19   Payton Mccallum, MD  doxycycline (VIBRA-TABS) 100 MG tablet Take 1 tablet (100 mg total) by mouth 2 (two) times daily. 06/13/19   Payton Mccallum, MD  fluticasone (FLONASE) 50 MCG/ACT nasal spray Place 2 sprays into both nostrils daily. 06/11/19   Wurst, [place], PA-C  ondansetron (ZOFRAN) 4 MG tablet Take 1 tablet (4 mg total) by mouth daily as needed for nausea or vomiting. 12/06/18 12/06/19  Enid Derry, PA-C  tadalafil (CIALIS) 5 MG tablet Take 1 tablet (5 mg total) by mouth daily as needed for erectile dysfunction. 05/08/19 06/07/19  Jerilee Field, MD    Family History Family History  Problem Relation Age of Onset  . Heart Problems Father     Social History Social History   Tobacco Use  . Smoking status: Former Smoker    Packs/day: 0.25    Years: 16.00    Pack years: 4.00    Types:  Cigarettes    Quit date: 2016    Years since quitting: 5.4  . Smokeless tobacco: Never Used  Substance Use Topics  . Alcohol use: Not Currently  . Drug use: Not Currently     Allergies   Penicillins   Review of Systems Review of Systems  Respiratory: Positive for cough.      Physical Exam Triage Vital Signs ED Triage Vitals  Enc Vitals Group     BP 06/13/19 1308 (!) 154/86     Pulse Rate 06/13/19 1308 90     Resp 06/13/19 1308 18     Temp 06/13/19 1308 98.3 F (36.8 C)     Temp Source 06/13/19 1308 Oral     SpO2 06/13/19 1308 99 %     Weight 06/13/19 1307 196 lb (88.9 kg)     Height 06/13/19 1307 5\' 10"  (1.778  m)     Head Circumference --      Peak Flow --      Pain Score 06/13/19 1307 7     Pain Loc --      Pain Edu? --      Excl. in GC? --    No data found.  Updated Vital Signs BP (!) 154/86 (BP Location: Left Arm)   Pulse 90   Temp 98.3 F (36.8 C) (Oral)   Resp 18   Ht 5\' 10"  (1.778 m)   Wt 88.9 kg   SpO2 99%   BMI 28.12 kg/m   Visual Acuity Right Eye Distance:   Left Eye Distance:   Bilateral Distance:    Right Eye Near:   Left Eye Near:    Bilateral Near:     Physical Exam Vitals and nursing note reviewed.  Constitutional:      General: He is not in acute distress.    Appearance: He is not toxic-appearing or diaphoretic.  HENT:     Right Ear: A middle ear effusion is present. Tympanic membrane is erythematous.     Left Ear: Tympanic membrane normal.     Nose: Congestion present.     Right Sinus: Maxillary sinus tenderness present.     Left Sinus: Maxillary sinus tenderness present.  Cardiovascular:     Rate and Rhythm: Normal rate.     Heart sounds: Normal heart sounds.  Pulmonary:     Effort: Pulmonary effort is normal. No respiratory distress.     Breath sounds: Normal breath sounds. No stridor. No wheezing, rhonchi or rales.  Musculoskeletal:     Cervical back: Neck supple.  Skin:    Findings: No rash.  Neurological:     Mental Status: He is alert.      UC Treatments / Results  Labs (all labs ordered are listed, but only abnormal results are displayed) Labs Reviewed - No data to display  EKG   Radiology DG Chest 2 View  Result Date: 06/13/2019 CLINICAL DATA:  Patient with cough and congestion. EXAM: CHEST - 2 VIEW COMPARISON:  None. FINDINGS: The heart size and mediastinal contours are within normal limits. Both lungs are clear. The visualized skeletal structures are unremarkable. IMPRESSION: No active cardiopulmonary disease. Electronically Signed   By: M.D.   On: 06/13/2019 13:41    Procedures Procedures (including critical care  time)  Medications Ordered in UC Medications - No data to display  Initial Impression / Assessment and Plan / UC Course  I have reviewed the triage vital signs and the nursing notes.  Pertinent labs &  imaging results that were available during my care of the patient were reviewed by me and considered in my medical decision making (see chart for details).      Final Clinical Impressions(s) / UC Diagnoses   Final diagnoses:  Acute maxillary sinusitis, recurrence not specified  Cough     Discharge Instructions     Flonase nasal spray    ED Prescriptions    Medication Sig Dispense Auth. Provider   doxycycline (VIBRA-TABS) 100 MG tablet Take 1 tablet (100 mg total) by mouth 2 (two) times daily. 20 tablet Norval Gable, MD   chlorpheniramine-HYDROcodone (TUSSIONEX PENNKINETIC ER) 10-8 MG/5ML SUER Take 5 mLs by mouth at bedtime as needed. 60 mL Norval Gable, MD   albuterol (VENTOLIN HFA) 108 (90 Base) MCG/ACT inhaler Inhale 1-2 puffs into the lungs every 6 (six) hours as needed for wheezing or shortness of breath. 8 g Norval Gable, MD      1. x-ray results and diagnosis reviewed with patient 2. rx as per orders above; reviewed possible side effects, interactions, risks and benefits  3. Recommend supportive treatment with flonase nasal spray, otc meds prn 4. Follow-up prn if symptoms worsen or don't improve  I have reviewed the PDMP during this encounter.   Norval Gable, MD 06/13/19 [id number]

## 2019-06-13 NOTE — ED Triage Notes (Signed)
Patient complains of cough, congestion, tightness in chest with taking breaths x 2-3 weeks. Patient states that he was negative for Covid on Thursday. States that he has also had [employer] vaccine. Reports that he has noticed back and loss of appetite. Patient did a virtual visit and was given Prednisone and tessalon with out relief.

## 2019-07-01 ENCOUNTER — Other Ambulatory Visit: Payer: Self-pay

## 2019-07-01 ENCOUNTER — Encounter: Payer: Self-pay | Admitting: Emergency Medicine

## 2019-07-01 ENCOUNTER — Ambulatory Visit
Admission: EM | Admit: 2019-07-01 | Discharge: 2019-07-01 | Disposition: A | Discharge status: Home / Self Care | Payer: BC Managed Care – PPO | Attending: Emergency Medicine | Admitting: Emergency Medicine

## 2019-07-01 DIAGNOSIS — R05 Cough: Secondary | ICD-10-CM

## 2019-07-01 DIAGNOSIS — R059 Cough, unspecified: Secondary | ICD-10-CM

## 2019-07-01 MED ORDER — AEROCHAMBER PLUS MISC: 2 refills | Status: AC

## 2019-07-01 MED ORDER — FLUTICASONE-SALMETEROL 100-50 MCG/DOSE IN AEPB: 1.0000 | INHALATION_SPRAY | Freq: Two times a day (BID) | RESPIRATORY_TRACT | 0 refills | Status: AC

## 2019-07-01 NOTE — ED Provider Notes (Signed)
HPI  SUBJECTIVE:  Taylor Bryant is a 39 y.o. male who presents with over a month of nonproductive dry cough, fatigue. Had urgent care E-visit on 6/3 with 2 weeks of a persistent dry cough.  Thought to have viral URI with cough and possible Covid infection.  He was prescribed Zyrtec, Flonase, Tessalon, prednisone 40 mg for 5 days.  Covid test done at [facility] was negative. Seen here 2 days later on 6/5 with cough, chest tightness, wheezing sinus congestion/headaches, found to have maxillary sinusitis.  Chest x-ray was normal.  Was prescribed doxycycline, Tussionex, albuterol and was advised to continue Flonase.  States that the nasal congestion, postnasal drip, sinus pain and pressure has resolved.  He reports coughing all night long, shortness of breath.  He has had similar cough before which was improved with Symbicort.  No fevers, wheezing, chest pain.  No unintentional weight loss, night sweats.  He states that he started omeprazole last week for GERD symptoms.  He is still using Flonase.  States that the prednisone, Tussionex, Tessalon did not help.  Is not using the albuterol because it makes his heart race.  No alleviating factors.  His cough is worse with going outside on high pollen days, at night, and after eating.  He has a past medical history of GERD, asthma.  Has not taken Symbicort since 2018.  He does not have a spacer.  He is a former smoker, he quit vaping 1 month ago.  He has a history of bicuspid aortic valve, thoracic outlet syndrome, von Willebrand disease.  No history HIV, cancer, hypertension, ACE or ARB use, diabetes, marijuana use.  PMD: He is establishing care with Dr. Judithann Graves on 7/19  Past Medical History:  Diagnosis Date  . Bicuspid aortic valve   . Heart murmur   . Hyperlipidemia   . Panic attacks   . Schizoaffective disorder (HCC)   . Von Willebrand disease (HCC)     Past Surgical History:  Procedure Laterality Date  . TOOTH EXTRACTION      Family History   Problem Relation Age of Onset  . Heart Problems Father     Social History   Tobacco Use  . Smoking status: Former Smoker    Packs/day: 0.25    Years: 16.00    Pack years: 4.00    Types: Cigarettes    Quit date: 2016    Years since quitting: 5.4  . Smokeless tobacco: Never Used  Vaping Use  . Vaping Use: Never used  Substance Use Topics  . Alcohol use: Not Currently  . Drug use: Not Currently    No current facility-administered medications for this encounter.  Current Outpatient Medications:  .  albuterol (VENTOLIN HFA) 108 (90 Base) MCG/ACT inhaler, Inhale 1-2 puffs into the lungs every 6 (six) hours as needed for wheezing or shortness of breath., Disp: 8 g, Rfl: 0 .  buPROPion (WELLBUTRIN XL) 150 MG 24 hr tablet, Take 150 mg by mouth daily., Disp: , Rfl:  .  cetirizine (ZYRTEC) 10 MG tablet, Take 1 tablet (10 mg total) by mouth daily., Disp: 30 tablet, Rfl: 0 .  clonazePAM (KLONOPIN) 0.5 MG tablet, Take 0.5 mg by mouth 2 (two) times daily as needed for anxiety., Disp: , Rfl:  .  ondansetron (ZOFRAN) 4 MG tablet, Take 1 tablet (4 mg total) by mouth daily as needed for nausea or vomiting., Disp: 8 tablet, Rfl: 0 .  fluticasone (FLONASE) 50 MCG/ACT nasal spray, Place 2 sprays into both nostrils daily.,  Disp: 16 g, Rfl: 0 .  Fluticasone-Salmeterol (ADVAIR DISKUS) 100-50 MCG/DOSE AEPB, Inhale 1 puff into the lungs in the morning and at bedtime., Disp: 60 each, Rfl: 0 .  Spacer/Aero-Holding Chambers (AEROCHAMBER PLUS) inhaler, Use as instructed, Disp: 1 each, Rfl: 2 .  tadalafil (CIALIS) 5 MG tablet, Take 1 tablet (5 mg total) by mouth daily as needed for erectile dysfunction., Disp: 30 tablet, Rfl: 11  Allergies  Allergen Reactions  . Penicillins Rash     ROS  As noted in HPI.   Physical Exam  BP 120/75 (BP Location: Left Arm)   Pulse 87   Temp 98.3 F (36.8 C) (Oral)   Resp 18   Ht 5\' 10"  (1.778 m)   Wt 88.9 kg   SpO2 96%   BMI 28.12 kg/m   Constitutional:  Well developed, well nourished, no acute distress Eyes:  EOMI, conjunctiva normal bilaterally HENT: Normocephalic, atraumatic,mucus membranes moist positive mucoid nasal congestion.  No maxillary or frontal sinus tenderness.  Unable to completely visualize oropharynx. Respiratory: Normal inspiratory effort lungs clear bilaterally, good air movement.  Positive anterior and lateral chest wall tenderness Cardiovascular: Normal rate regular rhythm no murmurs rubs or gallops GI: nondistended skin: No rash, skin intact Musculoskeletal: no deformities Neurologic: Alert & oriented x 3, no focal neuro deficits Psychiatric: Speech and behavior appropriate   ED Course   Medications - No data to display  No orders of the defined types were placed in this encounter.   No results found for this or any previous visit (from the past 24 hour(s)). No results found.  ED Clinical Impression  1. Cough      ED Assessment/Plan  Previous labs, records, x-ray reports reviewed.  As noted in HPI.  Recent x-ray was negative for malignancy, pulmonary edema, pneumonia.  His lungs are clear today, so we will not repeat chest x-ray.  He is already addressing the postnasal drip with Flonase.  He is addressing acid reflux with omeprazole.  Will prescribe him a spacer for him to use with his albuterol rescue inhaler as needed, and will prescribe Advair.  Symbicort is too expensive for him.  Advised pulmonology follow-up if the Advair, Flonase and omeprazole do not work.  Dr. Ritta Slot on call.  Patient states that the steroids, antibiotics, Tessalon and Tussionex did not help him. . Discussed imaging, MDM, treatment plan, and plan for follow-up with patient. Discussed sn/sx that should prompt return to the ED. patient agrees with plan.   Meds ordered this encounter  Medications  . Fluticasone-Salmeterol (ADVAIR DISKUS) 100-50 MCG/DOSE AEPB    Sig: Inhale 1 puff into the lungs in the morning and at bedtime.     Dispense:  60 each    Refill:  0  . Spacer/Aero-Holding Chambers (AEROCHAMBER PLUS) inhaler    Sig: Use as instructed    Dispense:  1 each    Refill:  2    *This clinic note was created using Dragon dictation software. Therefore, there may be occasional mistakes despite careful proofreading.   ?    Melynda Ripple, MD 07/01/19 1857

## 2019-07-01 NOTE — ED Triage Notes (Signed)
Pt c/o cough. Started about a month ago. He was seen on 06/13/19 and treated but he states the cough and fatigue is lingering. He was tested for covid and was negative.

## 2019-07-01 NOTE — Discharge Instructions (Addendum)
Continue Flonase.  May want to try saline nasal irrigation with a NeilMed sinus rinse and distilled water as often as you want to help with the postnasal drip.  Continue the omeprazole.  Use the spacer for your albuterol rescue inhaler.  May take 2 puffs every 4-6 hours as needed.  The Advair is for maintenance.  Taking it more often than prescribed will not do anything additional.  Follow-up with pulmonology if you are not getting better in a week.

## 2019-07-06 ENCOUNTER — Ambulatory Visit: Payer: BC Managed Care – PPO | Admitting: Internal Medicine

## 2019-07-17 ENCOUNTER — Ambulatory Visit (INDEPENDENT_AMBULATORY_CARE_PROVIDER_SITE_OTHER)
Admission: RE | Admit: 2019-07-17 | Discharge: 2019-07-17 | Disposition: A | Discharge status: Home / Self Care | Payer: Medicaid Other | Source: Ambulatory Visit

## 2019-07-17 DIAGNOSIS — K0889 Other specified disorders of teeth and supporting structures: Secondary | ICD-10-CM

## 2019-07-17 DIAGNOSIS — J4520 Mild intermittent asthma, uncomplicated: Secondary | ICD-10-CM

## 2019-07-17 DIAGNOSIS — K047 Periapical abscess without sinus: Secondary | ICD-10-CM

## 2019-07-17 DIAGNOSIS — K068 Other specified disorders of gingiva and edentulous alveolar ridge: Secondary | ICD-10-CM

## 2019-07-17 MED ORDER — TRAMADOL HCL 50 MG PO TABS: 50.0000 mg | ORAL_TABLET | Freq: Four times a day (QID) | ORAL | 0 refills | Status: AC | PRN

## 2019-07-17 MED ORDER — BUDESONIDE-FORMOTEROL FUMARATE 160-4.5 MCG/ACT IN AERO: 2.0000 | INHALATION_SPRAY | Freq: Every day | RESPIRATORY_TRACT | 12 refills | Status: AC

## 2019-07-17 MED ORDER — CLINDAMYCIN HCL 150 MG PO CAPS: 150.0000 mg | ORAL_CAPSULE | Freq: Three times a day (TID) | ORAL | 0 refills | Status: AC

## 2019-07-17 NOTE — ED Provider Notes (Signed)
[facility]  Virtual Visit via Video Note:  Taylor Bryant  initiated request for Telemedicine visit with [facility] team. I connected with Taylor Bryant  on 07/17/2019 at 9:49 AM  for a synchronized telemedicine visit using a video enabled HIPPA compliant telemedicine application. I verified that I am speaking with Taylor Bryant  using two identifiers. Taylor Lex, NP  was physically located in a [facility] site and Taylor Bryant was located at a different location.   The limitations of evaluation and management by telemedicine as well as the availability of in-person appointments were discussed. Patient was informed that he  may incur a bill ( including co-pay) for this virtual visit encounter. Taylor Bryant  expressed understanding and gave verbal consent to proceed with virtual visit.   [id number] 07/17/19 Arrival Time: 6606  CC: Dental pain  SUBJECTIVE: History from: patient.  Taylor Bryant is a 39 y.o. male who presents with abrupt onset of sore and swollen gums, right upper jaw pain, history of dental infections.  Reports that he is having issues with tooth sensitivity as well as bleeding gums.  Also reports that he has insurance now and is requesting that we can change his Advair back to Symbicort as he feels like this works better for him.  He has been taking Tylenol for his dental pain with little relief.  He does have a dentist that he is trying to get in with for next week. Reports previous symptoms in the past.  Denies fever, chills, fatigue, ear pain, sinus pain, rhinorrhea, nasal congestion, cough, SOB, wheezing, chest pain, nausea, rash, changes in bowel or bladder habits.     ROS: As per HPI.  All other pertinent ROS negative.     Past Medical History:  Diagnosis Date  . Bicuspid aortic valve   . Heart murmur   . Hyperlipidemia   . Panic attacks   . Schizoaffective disorder (HCC)   . Von Willebrand disease (HCC)    Past Surgical History:  Procedure  Laterality Date  . TOOTH EXTRACTION     Allergies  Allergen Reactions  . Penicillins Rash   No current facility-administered medications on file prior to encounter.   Current Outpatient Medications on File Prior to Encounter  Medication Sig Dispense Refill  . [DISCONTINUED] loratadine (CLARITIN) 10 MG tablet Claritin 10 mg tablet  Take 1 tablet every day by oral route.    Marland Kitchen albuterol (VENTOLIN HFA) 108 (90 Base) MCG/ACT inhaler Inhale 1-2 puffs into the lungs every 6 (six) hours as needed for wheezing or shortness of breath. 8 g 0  . buPROPion (WELLBUTRIN XL) 150 MG 24 hr tablet Take 150 mg by mouth daily.    . cetirizine (ZYRTEC) 10 MG tablet Take 1 tablet (10 mg total) by mouth daily. 30 tablet 0  . clonazePAM (KLONOPIN) 0.5 MG tablet Take 0.5 mg by mouth 2 (two) times daily as needed for anxiety.    . fluticasone (FLONASE) 50 MCG/ACT nasal spray Place 2 sprays into both nostrils daily. 16 g 0  . Fluticasone-Salmeterol (ADVAIR DISKUS) 100-50 MCG/DOSE AEPB Inhale 1 puff into the lungs in the morning and at bedtime. 60 each 0  . ondansetron (ZOFRAN) 4 MG tablet Take 1 tablet (4 mg total) by mouth daily as needed for nausea or vomiting. 8 tablet 0  . Spacer/Aero-Holding Chambers (AEROCHAMBER PLUS) inhaler Use as instructed 1 each 2  . tadalafil (CIALIS) 5 MG tablet Take 1 tablet (5 mg total) by mouth  daily as needed for erectile dysfunction. 30 tablet 11   Social History   Socioeconomic History  . Marital status: Divorced    Spouse name: Not on file  . Number of children: 1  . Years of education: Master's  . Highest education level: Master's degree (e.g., MA, MS, MEng, MEd, MSW, MBA)  Occupational History    Employer: [id number]  . Occupation: Previously Sport and exercise psychologist: [employer]    Comment: 2.5 years ago  Tobacco Use  . Smoking status: Former Smoker    Packs/day: 0.25    Years: 16.00    Pack years: 4.00    Types: Cigarettes    Quit date: 2016    Years since  quitting: 5.5  . Smokeless tobacco: Never Used  Vaping Use  . Vaping Use: Never used  Substance and Sexual Activity  . Alcohol use: Not Currently  . Drug use: Not Currently  . Sexual activity: Yes    Birth control/protection: Condom  Other Topics Concern  . Not on file  Social History Narrative  . Not on file   Social Determinants of Health   Financial Resource Strain:   . Difficulty of Paying Living Expenses:   Food Insecurity:   . Worried About Programme researcher, broadcasting/film/video in the Last Year:   . Barista in the Last Year:   Transportation Needs:   . Freight forwarder (Medical):   Marland Kitchen Lack of Transportation (Non-Medical):   Physical Activity:   . Days of Exercise per Week:   . Minutes of Exercise per Session:   Stress:   . Feeling of Stress :   Social Connections:   . Frequency of Communication with Friends and Family:   . Frequency of Social Gatherings with Friends and Family:   . Attends Religious Services:   . Active Member of Clubs or Organizations:   . Attends Banker Meetings:   Marland Kitchen Marital Status:   Intimate Partner Violence:   . Fear of Current or Ex-Partner:   . Emotionally Abused:   Marland Kitchen Physically Abused:   . Sexually Abused:    Family History  Problem Relation Age of Onset  . Heart Problems Father     OBJECTIVE:   There were no vitals filed for this visit.  General appearance: alert; no distress Eyes: EOMI grossly HENT: normocephalic; atraumatic Neck: supple with FROM Lungs: normal respiratory effort; speaking in full sentences without difficulty Extremities: moves extremities without difficulty Skin: No obvious rashes Neurologic: No facial asymmetries Psychological: alert and cooperative; normal mood and affect  ASSESSMENT & PLAN:  1. Pain, dental   2. Bleeding gums   3. Mild intermittent asthma, unspecified whether complicated   4. Dental infection     Meds ordered this encounter  Medications  . budesonide-formoterol  (SYMBICORT) 160-4.5 MCG/ACT inhaler    Sig: Inhale 2 puffs into the lungs daily.    Dispense:  1 Inhaler    Refill:  12    Order Specific Question:   Supervising Provider    Answer:   Merrilee Jansky [id number]  . clindamycin (CLEOCIN) 150 MG capsule    Sig: Take 1 capsule (150 mg total) by mouth 3 (three) times daily for 7 days.    Dispense:  21 capsule    Refill:  0    Order Specific Question:   Supervising Provider    Answer:   Merrilee Jansky [id number]  . traMADol (ULTRAM) 50 MG tablet  Sig: Take 1 tablet (50 mg total) by mouth every 6 (six) hours as needed.    Dispense:  15 tablet    Refill:  0    Order Specific Question:   Supervising Provider    Answer:   Merrilee Jansky [id number]      Prescribed clindamycin for dental infection  Prescribed tramadol for dental pain  Also prescribed Symbicort.  May stop Advair. Instructed to see dentist next week, to have dental pain and infection further addressed and evaluated follow up with PCP if symptoms persists Return or go to ER if patient has any new or worsening symptoms such as fever, chills, nausea, vomiting, worsening sore throat, cough, abdominal pain, chest pain, changes in bowel or bladder habits  Reviewed PDMP this encounter  I discussed the assessment and treatment plan with the patient. The patient was provided an opportunity to ask questions and all were answered. The patient agreed with the plan and demonstrated an understanding of the instructions.   The patient was advised to call back or seek an in-person evaluation if the symptoms worsen or if the condition fails to improve as anticipated.  I provided 12 minutes of non-face-to-face time during this encounter.  Taylor Lex, NP  07/17/2019 9:49 AM         Moshe Cipro, NP 07/17/19 [phone]

## 2019-07-17 NOTE — Discharge Instructions (Signed)
Take the antibiotic as prescribed.    I have prescribed Symbicort for you to use.  You may stop the Advair.  I have sent in clindamycin for your dental infection.  I have also sent in tramadol for dental pain.  Follow-up with your dentist as soon as possible. Please call to make an appointment with a dentist as soon as possible.    Return here or go to the emergency department if you develop increased pain, swelling, fever, chills, or other concerning symptoms.

## 2019-07-27 ENCOUNTER — Ambulatory Visit: Payer: BC Managed Care – PPO | Admitting: Internal Medicine

## 2019-09-18 ENCOUNTER — Ambulatory Visit: Payer: Self-pay | Admitting: Internal Medicine

## 2019-09-29 ENCOUNTER — Ambulatory Visit: Payer: Self-pay | Admitting: Internal Medicine

## 2020-05-06 ENCOUNTER — Ambulatory Visit: Payer: Self-pay | Admitting: Urology

## 2020-05-26 ENCOUNTER — Ambulatory Visit: Payer: Self-pay | Admitting: Urology

## 2020-05-27 ENCOUNTER — Encounter: Payer: Self-pay | Admitting: Urology

## 2021-01-12 ENCOUNTER — Emergency Department
Admission: EM | Admit: 2021-01-12 | Discharge: 2021-01-12 | Disposition: A | Discharge status: Home / Self Care | Payer: BLUE CROSS/BLUE SHIELD | Attending: Emergency Medicine | Admitting: Emergency Medicine

## 2021-01-12 ENCOUNTER — Encounter: Payer: Self-pay | Admitting: Emergency Medicine

## 2021-01-12 ENCOUNTER — Other Ambulatory Visit: Payer: Self-pay

## 2021-01-12 DIAGNOSIS — R509 Fever, unspecified: Secondary | ICD-10-CM | POA: Insufficient documentation

## 2021-01-12 DIAGNOSIS — U071 COVID-19: Secondary | ICD-10-CM

## 2021-01-12 DIAGNOSIS — R059 Cough, unspecified: Secondary | ICD-10-CM | POA: Diagnosis not present

## 2021-01-12 DIAGNOSIS — R519 Headache, unspecified: Secondary | ICD-10-CM | POA: Diagnosis not present

## 2021-01-12 DIAGNOSIS — Z20822 Contact with and (suspected) exposure to covid-19: Secondary | ICD-10-CM | POA: Diagnosis not present

## 2021-01-12 LAB — RESP PANEL BY RT-PCR (FLU A&B, COVID) ARPGX2
Influenza A by PCR: NEGATIVE
Influenza B by PCR: NEGATIVE
SARS Coronavirus 2 by RT PCR: NEGATIVE

## 2021-01-12 MED ORDER — ALBUTEROL SULFATE HFA 108 (90 BASE) MCG/ACT IN AERS
2.0000 | INHALATION_SPRAY | Freq: Four times a day (QID) | RESPIRATORY_TRACT | 2 refills | Status: AC | PRN
Start: 2021-01-12 — End: ?

## 2021-01-12 MED ORDER — BENZONATATE 100 MG PO CAPS: 100.0000 mg | ORAL_CAPSULE | Freq: Three times a day (TID) | ORAL | 0 refills | Status: AC | PRN

## 2021-01-12 MED ORDER — CETIRIZINE HCL 10 MG PO TABS: 10.0000 mg | ORAL_TABLET | Freq: Every day | ORAL | 2 refills | Status: AC

## 2021-01-12 NOTE — ED Triage Notes (Signed)
Says he has been feeling bad for a couple days.  Weak, legs hurt, no energy, fever.  Passing out.  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

## 2021-01-12 NOTE — ED Provider Triage Note (Signed)
Emergency Medicine Provider Triage Evaluation Note  Taylor Bryant , a 41 y.o. male  was evaluated in triage.  Pt complains of presents with complaints of fever, chills, URI symptoms, weakness and leg pain.  History of hemophilia.  Review of Systems  Positive: Fever, chills, weakness Negative: Chest pain or shortness of breath  Physical Exam  BP 131/81    Pulse 70    Temp 98.1 F (36.7 C)    Resp 16    Ht 5\' 11"  (1.803 m)    Wt 81.6 kg    SpO2 96%    BMI 25.10 kg/m  Gen:   Awake, no distress   Resp:  Normal effort  MSK:   Moves extremities without difficulty  Other:    Medical Decision Making  Medically screening exam initiated at 1:18 PM.  Appropriate orders placed.  Taylor Bryant was informed that the remainder of the evaluation will be completed by another provider, this initial triage assessment does not replace that evaluation, and the importance of remaining in the ED until their evaluation is complete.     Taylor Patricia, PA-C 01/12/21 1319

## 2021-01-12 NOTE — ED Provider Notes (Signed)
[facility] Provider Note  Patient Contact: 4:07 PM (approximate)   History   Weakness, Fever, and Leg Pain   HPI  Taylor Bryant is a 41 y.o. male presents to the emergency department with concern for nasal congestion, body aches, headache and ear pain for the past 2 days.  Patient's ex-wife and daughter currently have COVID pain.  Patient has some anterior chest wall discomfort when he coughs.  No current chest tightness or shortness of breath.  Patient states that he has been compliant with his medications at home.      Physical Exam   Triage Vital Signs: ED Triage Vitals [01/12/21 1316]  Enc Vitals Group     BP 131/81     Pulse Rate 70     Resp 16     Temp 98.1 F (36.7 C)     Temp src      SpO2 96 %     Weight 180 lb (81.6 kg)     Height 5\' 11"  (1.803 m)     Head Circumference      Peak Flow      Pain Score 7     Pain Loc      Pain Edu?      Excl. in GC?     Most recent vital signs: Vitals:   01/12/21 1316  BP: 131/81  Pulse: 70  Resp: 16  Temp: 98.1 F (36.7 C)  SpO2: 96%     Constitutional: Alert and oriented. Patient is lying supine. Eyes: Conjunctivae are normal. PERRL. EOMI. Head: Atraumatic. ENT:      Ears: Tympanic membranes are mildly injected with mild effusion bilaterally.       Nose: No congestion/rhinnorhea.      Mouth/Throat: Mucous membranes are moist. Posterior pharynx is mildly erythematous.  Hematological/Lymphatic/Immunilogical: No cervical lymphadenopathy.  Cardiovascular: Normal rate, regular rhythm. Normal S1 and S2.  Good peripheral circulation. Respiratory: Normal respiratory effort without tachypnea or retractions. Lungs CTAB. Good air entry to the bases with no decreased or absent breath sounds. Gastrointestinal: Bowel sounds 4 quadrants. Soft and nontender to palpation. No guarding or rigidity. No palpable masses. No distention. No CVA tenderness. Musculoskeletal: Full range of motion to all  extremities. No gross deformities appreciated. Neurologic:  Normal speech and language. No gross focal neurologic deficits are appreciated.  Skin:  Skin is warm, dry and intact. No rash noted. Psychiatric: Mood and affect are normal. Speech and behavior are normal. Patient exhibits appropriate insight and judgement.    ED Results / Procedures / Treatments   Labs (all labs ordered are listed, but only abnormal results are displayed) Labs Reviewed  RESP PANEL BY RT-PCR (FLU A&B, COVID) [id number]       IMPRESSION / MDM / ASSESSMENT AND PLAN / ED COURSE  I reviewed the triage vital signs and the nursing notes.                              Differential diagnosis includes, but is not limited to, COVID-19, influenza, RSV...  Assessment and plan COVID-63 41 year old male presents to the emergency department with viral URI-like symptoms.  Vital signs are reassuring at triage.  On physical exam, patient was alert, active and nontoxic-appearing.  COVID-19, influenza A and influenza B testing were ordered.  Patient tested negative for COVID-19 but his wife tested positive for COVID-19 during this emergency department encounter.  Rest and hydration were encouraged at home.  Patient was discharged with Tessalon Perles, Zyrtec and albuterol.  Tylenol was recommended for headache and fever.  Return precautions were given to return with new or worsening symptoms.      FINAL CLINICAL IMPRESSION(S) / ED DIAGNOSES   Final diagnoses:  COVID-19     Rx / DC Orders   ED Discharge Orders          Ordered    cetirizine (ZYRTEC ALLERGY) 10 MG tablet  Daily        01/12/21 1533    albuterol (VENTOLIN HFA) 108 (90 Base) MCG/ACT inhaler  Every 6 hours PRN        01/12/21 1533    benzonatate (TESSALON PERLES) 100 MG capsule  3 times daily PRN        01/12/21 1534             Note:  This document was prepared using Dragon voice recognition software and may include unintentional dictation  errors.   Pia Mau [place], PA-C 01/12/21 1610    Phineas Semen, MD 01/12/21 1640

## 2021-01-12 NOTE — Discharge Instructions (Signed)
You can take Tessalon Perles up to three times daily. You can take Zyrtec once daily.  You can take 2 puffs of Albuterol every four hours as needed for wheezing.

## 2021-01-16 IMAGING — CT CT HEAD W/O CM
4 series · 17 of 47 positions shown, 19 images · non-contrast
Comparison: CTA head 01/29/2018

CLINICAL DATA: Headache, generalized body ache and loose stools

EXAM:
CT HEAD WITHOUT CONTRAST
TECHNIQUE: Contiguous axial images were obtained from the base of the skull
through the vertex without intravenous contrast.

[Series 2: head wo · axial · 0.45mm/px · z∈[+435,+555]mm · 7 of 33 slices shown, 9 images]
[im 5/33  brain]
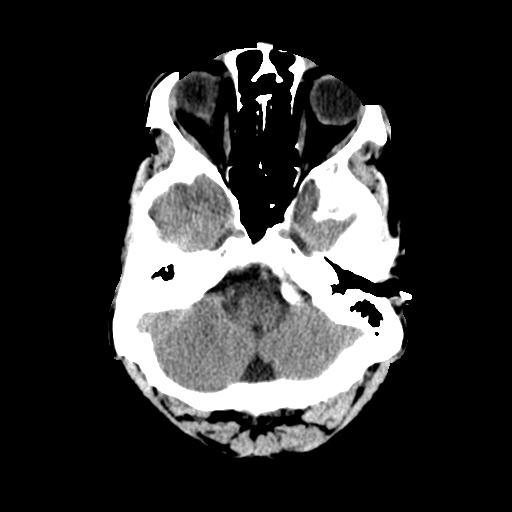
[im 5/33  bone]
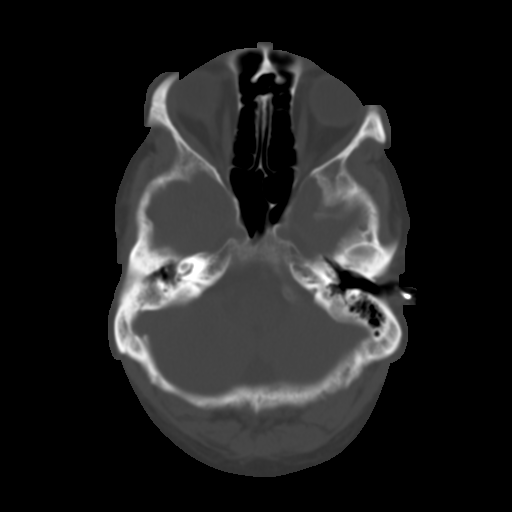
[im 9/33  brain]
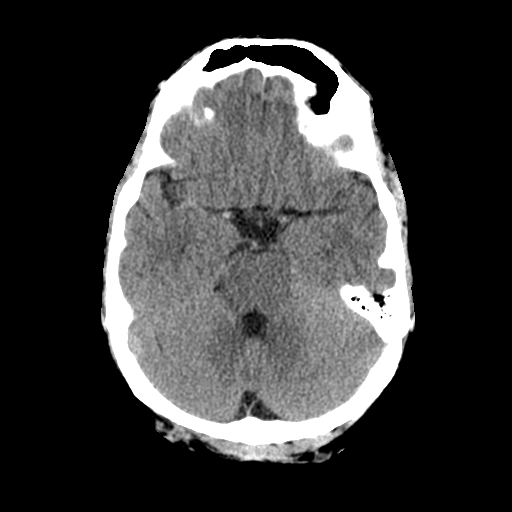
[im 13/33  brain]
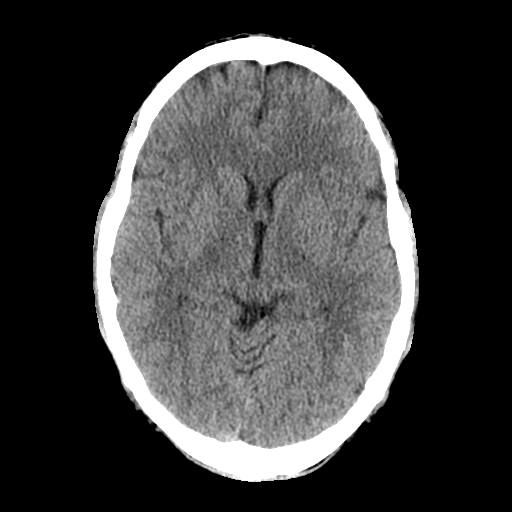
[im 17/33  brain]
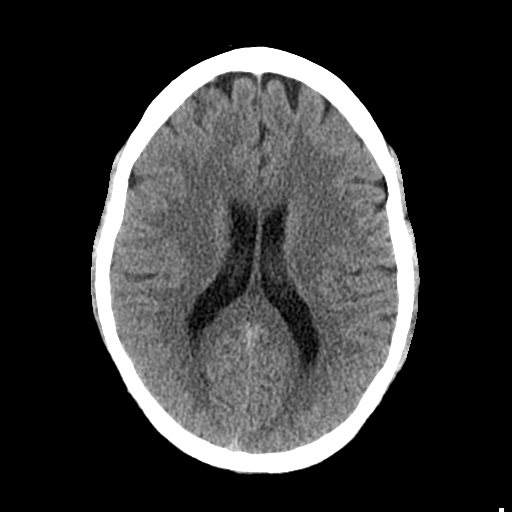
[im 21/33  brain]
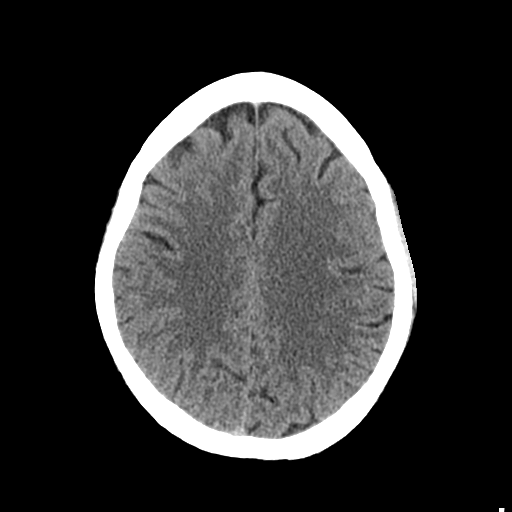
[im 21/33  bone]
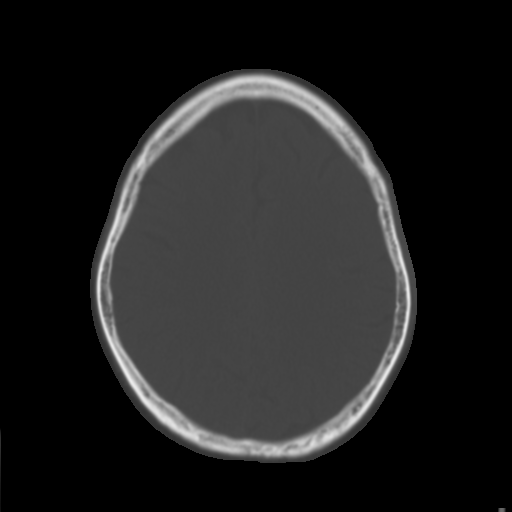
[im 25/33  brain]
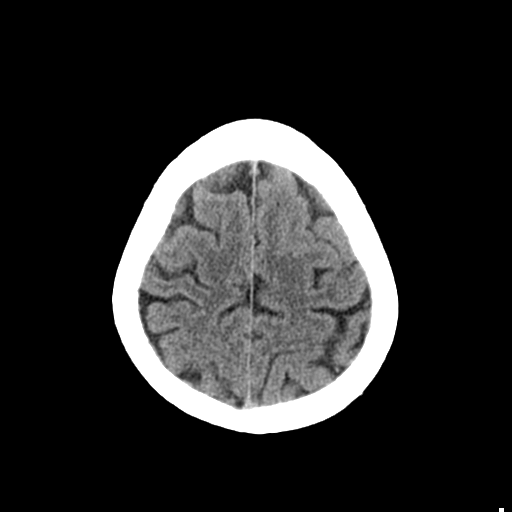
[im 29/33  brain]
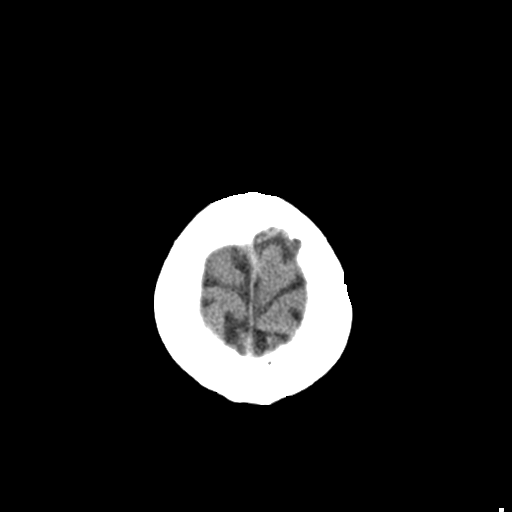

[Series 3: head bone · axial · 0.45mm/px · z∈[+431,+487]mm · 4 of 82 slices shown]
[im 9/82  bone]
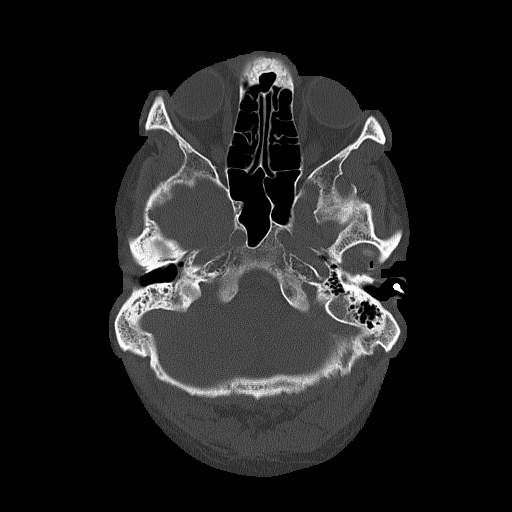
[im 17/82  bone]
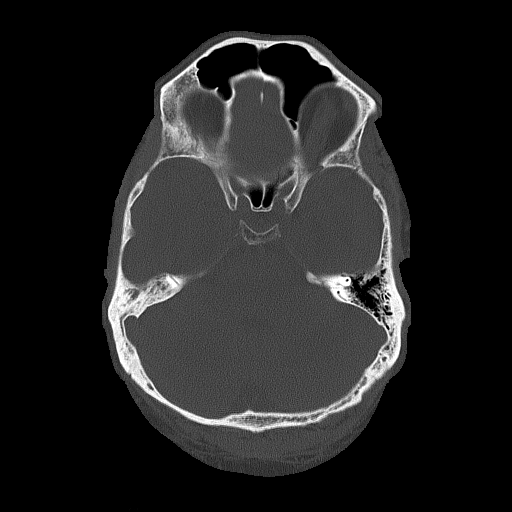
[im 25/82  bone]
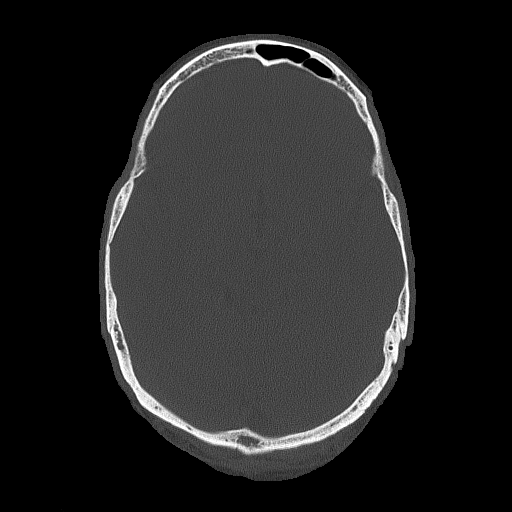
[im 37/82  bone]
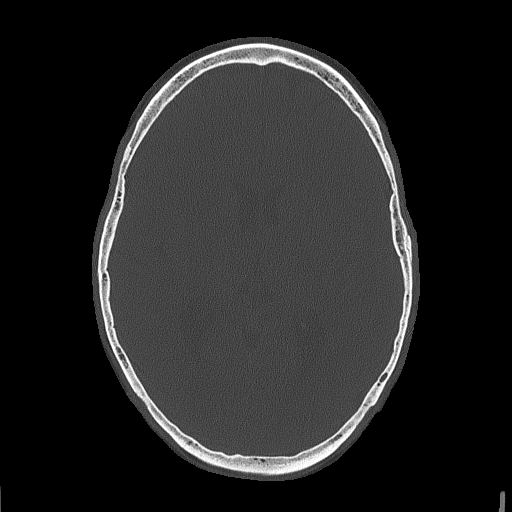

[Series 4: coronal soft tissue · coronal · 0.33mm/px · 3 of 67 slices shown]
[im 23/67  brain]
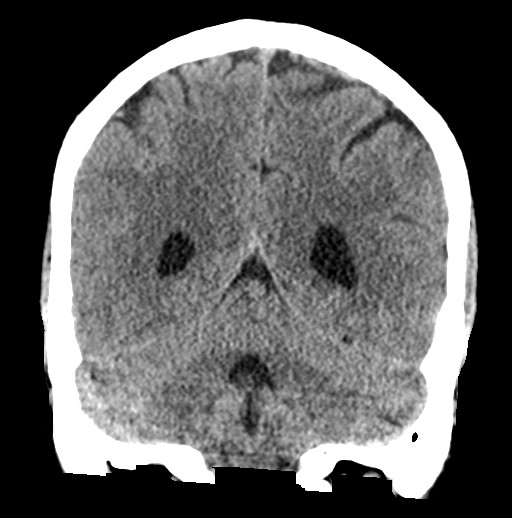
[im 30/67  brain]
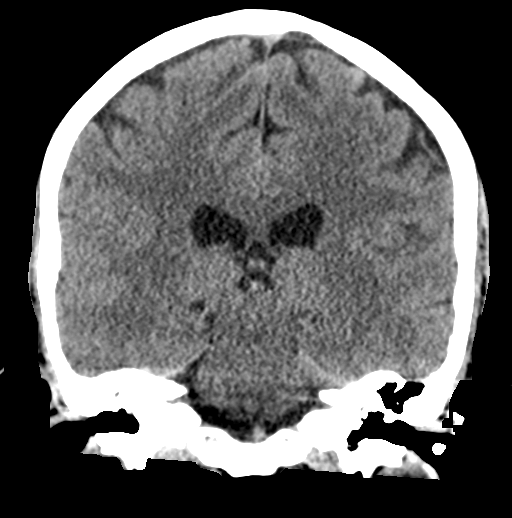
[im 37/67  brain]
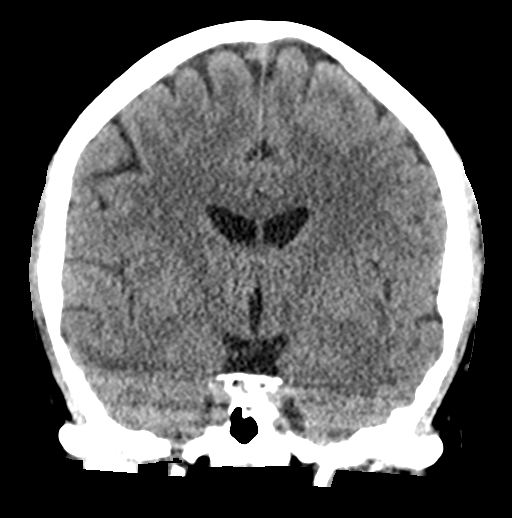

[Series 5: sagittal soft tissue · sagittal · 0.33mm/px · 3 of 51 slices shown]
[im 17/51  brain]
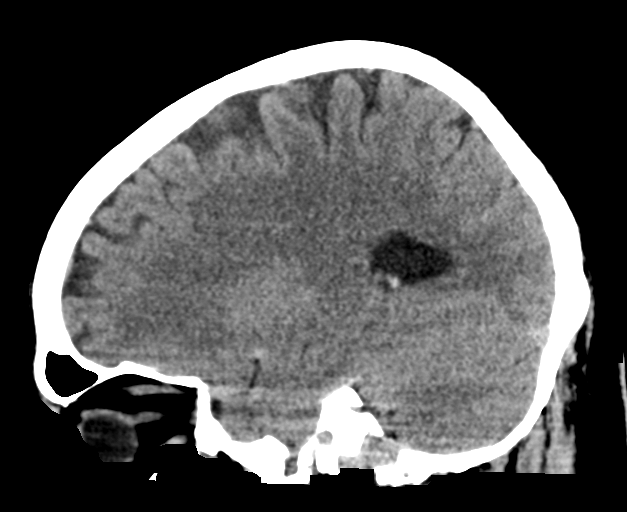
[im 26/51  brain]
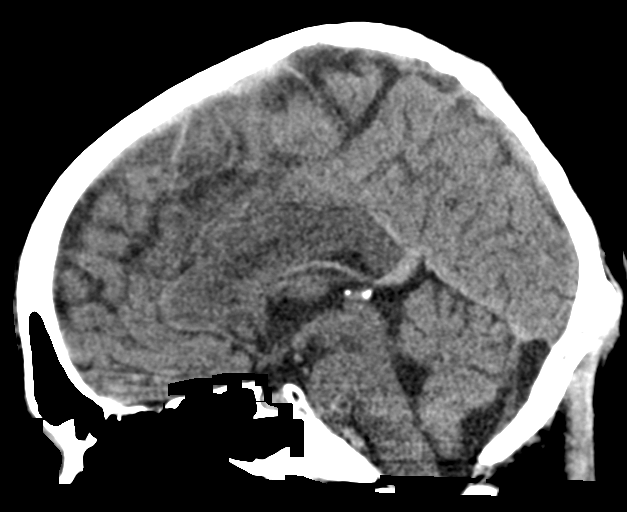
[im 34/51  brain]
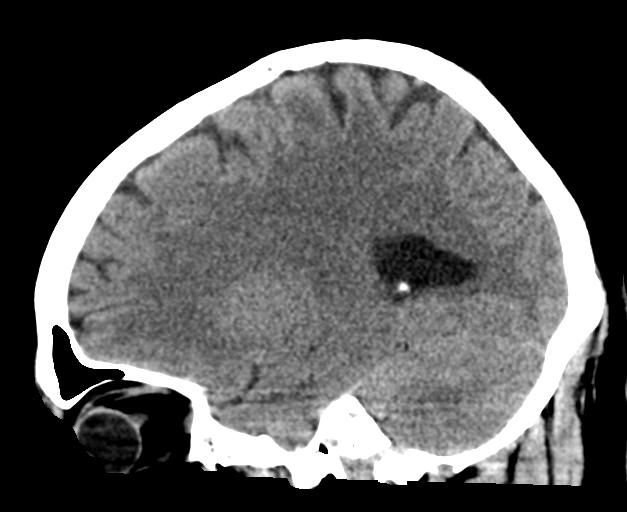

[17 of 47 positions shown; findings below may reference images not displayed]

FINDINGS: Brain: No evidence of acute infarction, hemorrhage, hydrocephalus,
extra-axial collection or mass lesion/mass effect.

Vascular: No hyperdense vessel or unexpected calcification.

Skull: No calvarial fracture or suspicious osseous lesion. No scalp
swelling or hematoma.

Sinuses/Orbits: Paranasal sinuses and left mastoid air cells are
predominantly clear. Chronic right mastoid effusion. Included
orbital structures are unremarkable.

Other: None
IMPRESSION: 1. No acute intracranial abnormality.
2. Chronic right mastoid effusion.

## 2021-03-07 ENCOUNTER — Encounter: Payer: Self-pay | Admitting: Internal Medicine

## 2021-03-07 DIAGNOSIS — G4719 Other hypersomnia: Secondary | ICD-10-CM

## 2021-03-09 NOTE — Procedures (Signed)
[facility]  Polysomnogram Report Part I                                                                 Phone: [phone] Fax: [phone]  Taylor Name: Taylor Bryant, Taylor Bryant: [id number]  Date of Birth: 02/24/1980 Acquisition Date: 03/07/2021  Referring Physician: Noralyn Pick, FNP     History: The Taylor is a 41 year old male who was referred for evaluation of possible sleep apnea with snoring and sleepiness. Medical History: acute headache, anxiety, asthma, benign prostate hyperplasia, bicuspid aortic valve, depression, thoracic outlet syndrome and Von Willebrand disease.  Medications: Wellbutrin XL, Valium, non formulary, Trileptal, Prazosin, Imitrex, Cialis and Tranexamic Acid.  Procedure: This routine overnight polysomnogram was performed on the [facility] 5 using the standard diagnostic protocol. This included 6 channels of EEG, 2 channels of EOG, chin EMG, bilateral anterior tibialis EMG, nasal/oral thermistor, PTAF (nasal pressure transducer), chest and abdominal wall movements, EKG, and pulse oximetry.  Description: The total recording time was 387.9 minutes. The total sleep time was 371.5 minutes. There were a total of 4.8 minutes of wakefulness after sleep onset for a very goodsleep efficiency of 95.8%. The latency to sleep onset was within normal limits at 11.6 minutes. The R sleep onset latency was within normal limits at 77.5 minutes. Sleep parameters, as a percentage of the total sleep time, demonstrated 10.0% of sleep was in N1 sleep, 51.7% N2, 10.8% N3 and 27.6% R sleep. There were a total of 135 arousals for an arousal index of 21.8 arousals per hour of sleep that was elevated.  Respiratory monitoring demonstrated nearly continuous severe degree of snoring in all positions. There were 14 apneas and hypopneas for an Apnea Hypopnea Index of 2.3 apneas and hypopneas per hour of sleep. The REM related apnea hypopnea index was 3.5/hr of REM sleep compared to  a NREM AHI of 1.8/hr.  The average duration of the respiratory events was 24.9 seconds with a maximum duration of 63.0 seconds. The respiratory events occurred almost exclusively in the supine position. The respiratory events were associated with peripheral oxygen desaturations on the average to 90%. The lowest oxygen desaturation associated with a respiratory event was 87%. Additionally, the baseline oxygen saturation during wakefulness was 96%, during NREM sleep averaged 95%, and during REM sleep averaged  96%. The total duration of oxygen < 90% was 0.3 minutes.  Cardiac monitoring- did not demonstrate transient cardiac decelerations associated with the apneas. There were no significant cardiac rhythm irregularities.   Periodic limb movement monitoring- did not demonstrate periodic limb movements.    Impression: This routine overnight polysomnogram did not demonstrate significant obstructive sleep apnea due to a low Apnea Hypopnea Index of 2.3 apneas and hypopneas per hour. The events occurred almost exclusively in the supine position.  Sleep efficiency was very good with only a slightly reduced percentage of slow wave sleep. An elevated arousal index was observed. The Taylor reports a history of hypersomnia with an elevated Epworth Sleepiness Score of 15 out of 24 and daytime napping despite 7 hours of sleep per night.  This may be due the Taylor's depression. If, however there is concern for unexplained hypersomnia, a repeat polysomnogram followed by a multiple sleep latency test is suggested.  The Taylor should ensure a minimum of 7-8 hours sleep per night in the 2 weeks prior to the study and a 2 week sleep diary should be maintained. Wellbutrin and Valium should be discontinued for a minimum of 2 weeks prior to testing but this should first be discussed with the prescribing physician.   Recommendations:     Would recommend weight loss in a Taylor with a BMI of 26.5.    Yevonne Pax, MD,  [facility] Diplomate ABMS-Pulmonary, Critical Care and Sleep Medicine  Electronically reviewed and digitally signed  [facility] Polysomnogram Report Part II  Phone: [phone] Fax: [phone]  Taylor Bryant Neck Size 16.0 in. Acquisition [phone]  Taylor first name Taylor Bryant Weight 190.0 lbs. Started 03/07/2021 at 11:26:36 PM  Birth date 19-Nov-1980 Height 71.0 in. Stopped 03/08/2021 at 6:12:00 AM  Age 20 BMI 26.5 lb/in2 Duration 387.9  Study Type Adult      Report generated by Ramon Dredge., RPSGT  Reviewed by: Valentino Hue. Henke, PhD, ABSM, FAASM Sleep Data: Lights Out: 11:34:00 PM Sleep Onset: 11:45:36 PM  Lights On: 6:01:54 AM Sleep Efficiency: 95.8 %  Total Recording Time: 387.9 min Sleep Latency (from Lights Off) 11.6 min  Total Sleep Time (TST): 371.5 min R Latency (from Sleep Onset): 77.5 min  Sleep Period Time: 375.5 min Total Bryant of awakenings: 8  Wake during sleep: 4.0 min Wake After Sleep Onset (WASO): 4.8 min   Sleep Data:         Arousal Summary: Stage  Latency from lights out (min) Latency from sleep onset (min) Duration (min) % Total Sleep Time  Normal values  N 1 11.6 0.0 37.0 10.0 (5%)  N 2 17.1 5.5 192.0 51.7 (50%)  N 3 35.1 23.5 40.0 10.8 (20%)  R 89.1 77.5 102.5 27.6 (25%)    Bryant Index  Spontaneous 112 18.1  Apneas & Hypopneas 13 2.1  RERAs 0 0.0       (Apneas & Hypopneas & RERAs)  (13) (2.1)  Limb Movement 10 1.6  Snore 0 0.0  TOTAL 135 21.8     Respiratory Data:  CA OA MA Apnea Hypopnea* A+ H RERA Total  Bryant 0 4 5 9 5 14  0 14  Mean Dur (sec) 0.0 16.9 30.1 24.2 26.0 24.9 0.0 24.9  Max Dur (sec) 0.0 18.5 63.0 63.0 40.0 63.0 0.0 63.0  Total Dur (min) 0.0 1.1 2.5 3.6 2.2 5.8 0.0 5.8  % of TST 0.0 0.3 0.7 1.0 0.6 1.6 0.0 1.6  Index (#/h TST) 0.0 0.6 0.8 1.5 0.8 2.3 0.0 2.3  *Hypopneas scored based on 4% or greater desaturation.  Sleep Stage:        REM NREM TST  AHI 3.5 1.8 2.3  RDI 3.5 1.8 2.3           Body Position  Data:  Sleep (min) TST (%) REM (min) NREM (min) CA (#) OA (#) MA (#) HYP (#) AHI (#/h) RERA (#) RDI (#/h) Desat (#)  Supine 139.3 37.50 42.4 96.9 0 4 4 4  5.2 0 5.2 13  Non-Supine 232.20 62.50 60.10 172.10 0.00 0.00 1.00 1.00 0.52 0 0.52 5.00  Left: 42.6 11.47 18.1 24.5 0 0 1 1 2.8 0 2.8 3  Right: 189.6 51.04 42.0 147.6 0 0 0 0 0.0 0 0.00 2     Snoring: Total Bryant of snoring episodes  0  Total time with snoring    min (   % of sleep)  Oximetry Distribution:             WK REM NREM TOTAL  Average (%)   96 96 95 95  < 90% 0.1 0.0 0.2 0.3  < 80% 0.0 0.0 0.0 0.0  < 70% 0.0 0.0 0.0 0.0  # of Desaturations* 2 7 10 19   Desat Index (#/hour) 7.3 4.1 2.2 3.1  Desat Max (%) 6 7 8 8   Desat Max Dur (sec) 66.0 103.0 61.0 103.0  Approx Min O2 during sleep 87  Approx min O2 during a respiratory event 87  Was Oxygen added (Y/N) and final rate No:   0 LPM  *Desaturations based on 4% or greater drop from baseline.   Cheyne Stokes Breathing: None Present    Heart Rate Summary:  Average Heart Rate During Sleep 63.3 bpm      Highest Heart Rate During Sleep (95th %) 70.0 bpm      Highest Heart Rate During Sleep 129 bpm      Highest Heart Rate During Recording (TIB) 157 bpm (artifact)   Heart Rate Observations: Event Type # Events   Bradycardia 0 Lowest HR Scored: N/A  Sinus Tachycardia During Sleep 0 Highest HR Scored: N/A  Narrow Complex Tachycardia 0 Highest HR Scored: N/A  Wide Complex Tachycardia 0 Highest HR Scored: N/A  Asystole 0 Longest Pause: N/A  Atrial Fibrillation 0 Duration Longest Event: N/A  Other Arrythmias  No Type:    Periodic Limb Movement Data: (Primary legs unless otherwise noted) Total # Limb Movement 13 Limb Movement Index 2.1  Total # PLMS    PLMS Index     Total # PLMS Arousals    PLMS Arousal Index     Percentage Sleep Time with PLMS   min (   % sleep)  Mean Duration limb movements (secs)

## 2021-07-24 IMAGING — CR DG CHEST 2V
2 series · 2 of 2 positions shown · non-contrast
Comparison: None.

CLINICAL DATA: Patient with cough and congestion.

EXAM:
CHEST - 2 VIEW

[chest pa]
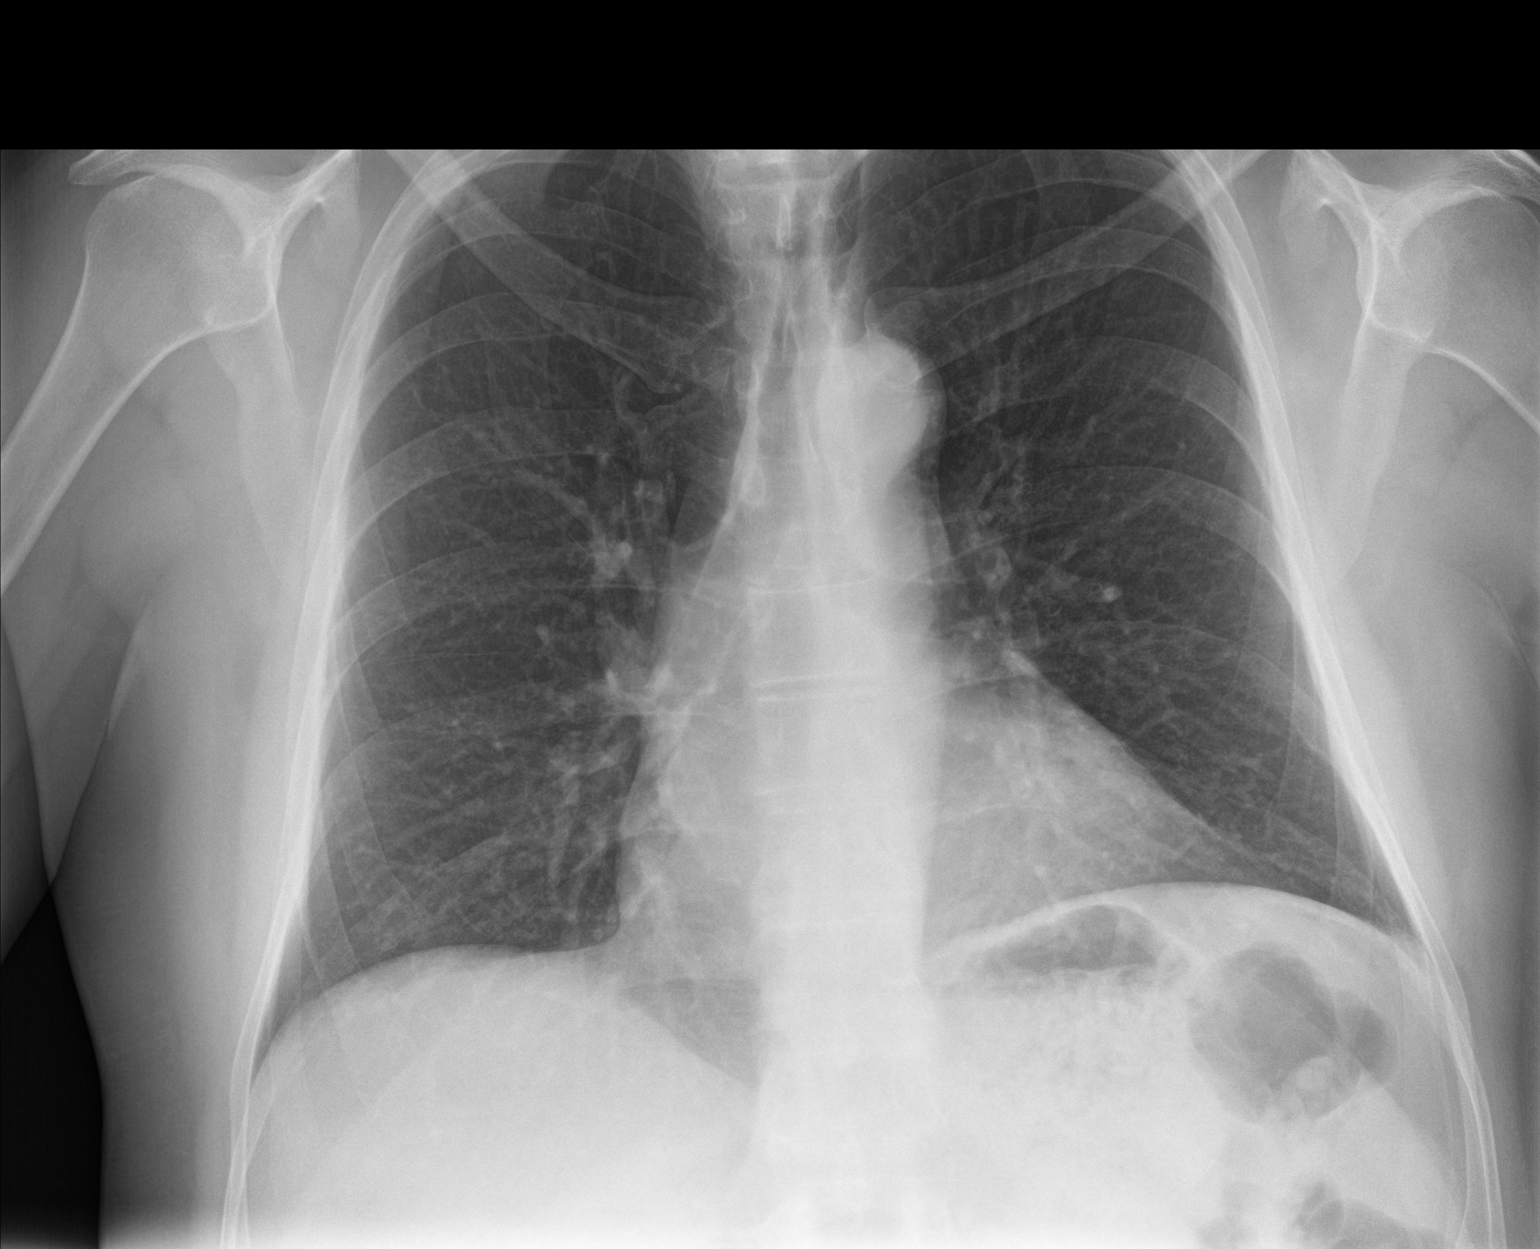

[chest lat]
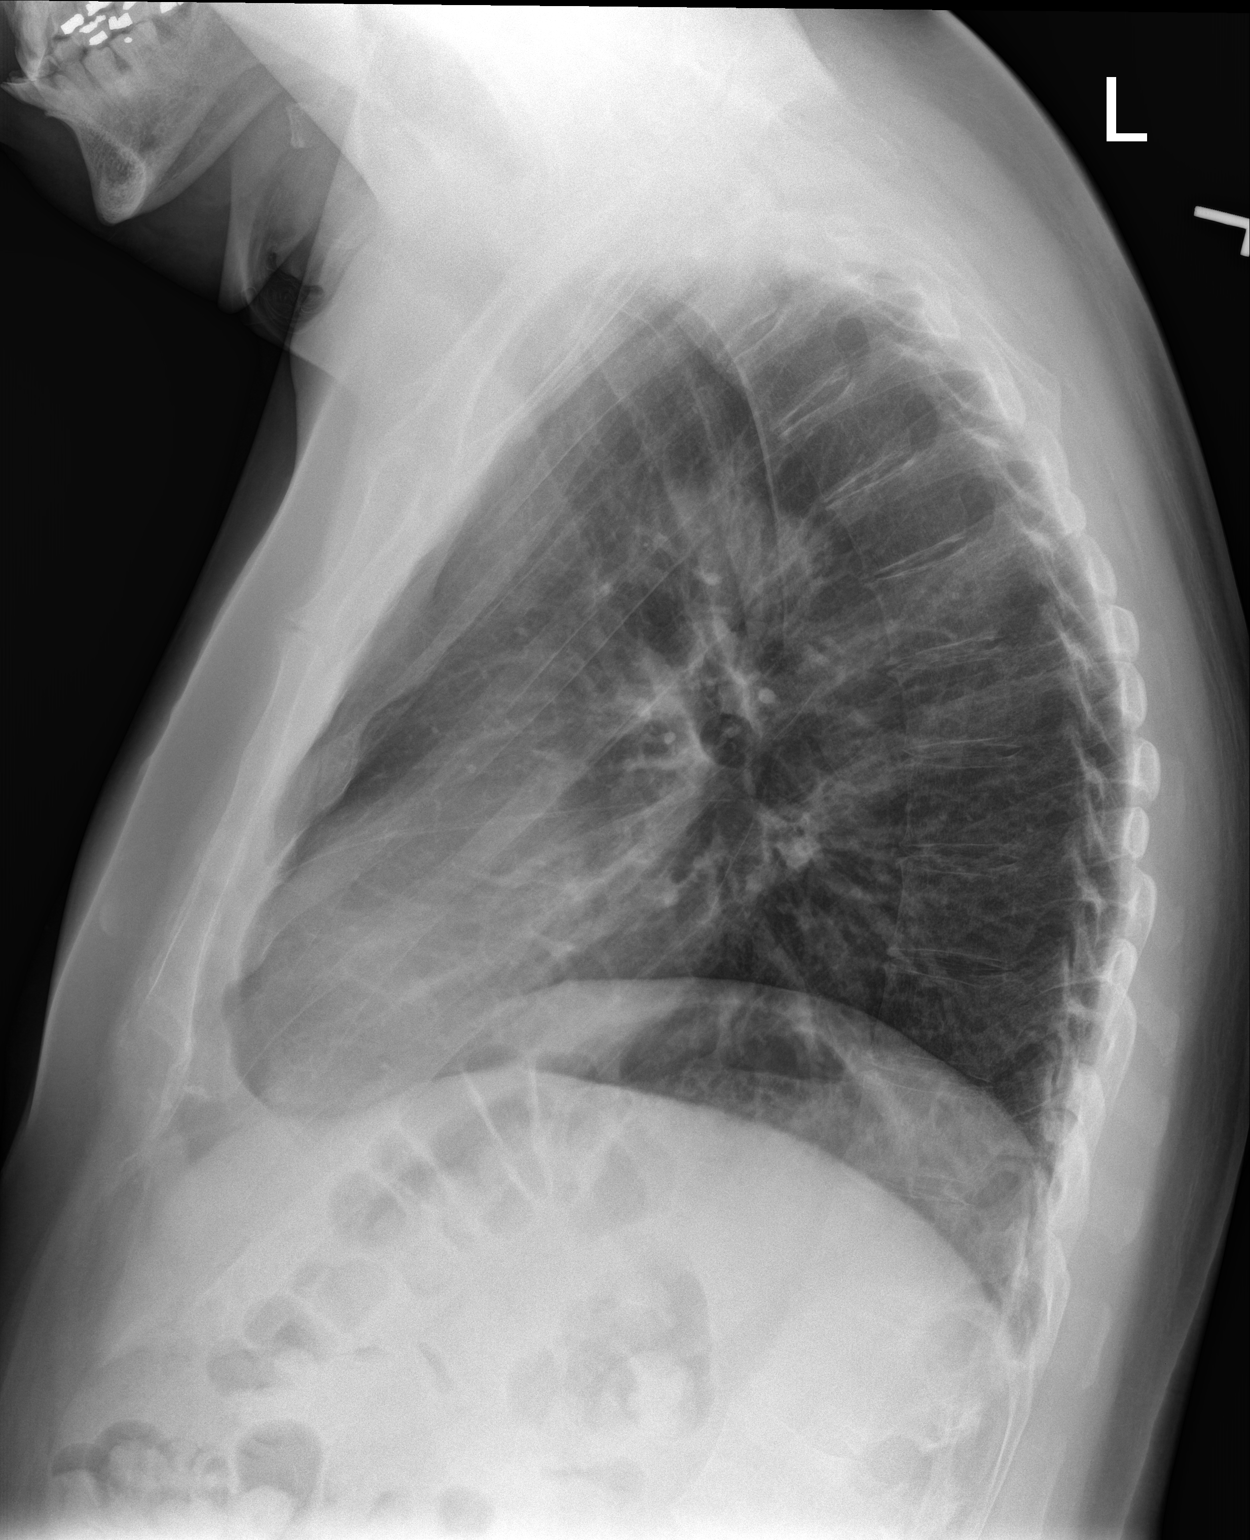

[2 of 2 positions shown; findings below may reference images not displayed]

FINDINGS: The heart size and mediastinal contours are within normal limits.
Both lungs are clear. The visualized skeletal structures are
unremarkable.
IMPRESSION: No active cardiopulmonary disease.
